# Patient Record
Sex: Female | Born: 1965 | Race: White | Hispanic: No | Marital: Married | Smoking: Former smoker
Health system: Southern US, Community
[De-identification: ages and names within clinical notes are randomized; demographics above are authoritative.]

## PROBLEM LIST (undated history)

## (undated) DIAGNOSIS — N951 Menopausal and female climacteric states: Secondary | ICD-10-CM

## (undated) DIAGNOSIS — N63 Unspecified lump in unspecified breast: Secondary | ICD-10-CM

## (undated) DIAGNOSIS — E538 Deficiency of other specified B group vitamins: Secondary | ICD-10-CM

## (undated) DIAGNOSIS — J45909 Unspecified asthma, uncomplicated: Secondary | ICD-10-CM

## (undated) DIAGNOSIS — C4491 Basal cell carcinoma of skin, unspecified: Secondary | ICD-10-CM

## (undated) DIAGNOSIS — M1990 Unspecified osteoarthritis, unspecified site: Secondary | ICD-10-CM

## (undated) DIAGNOSIS — F418 Other specified anxiety disorders: Secondary | ICD-10-CM

## (undated) HISTORY — DX: Unspecified osteoarthritis, unspecified site: M19.90

## (undated) HISTORY — DX: Other specified anxiety disorders: F41.8

## (undated) HISTORY — PX: APPENDECTOMY: SHX54

## (undated) HISTORY — DX: Deficiency of other specified B group vitamins: E53.8

## (undated) HISTORY — DX: Unspecified lump in unspecified breast: N63.0

## (undated) HISTORY — DX: Basal cell carcinoma of skin, unspecified: C44.91

## (undated) HISTORY — DX: Menopausal and female climacteric states: N95.1

## (undated) HISTORY — PX: OTHER SURGICAL HISTORY: SHX169

## (undated) HISTORY — PX: CRYOTHERAPY: SHX1416

---

## 2008-02-21 ENCOUNTER — Ambulatory Visit: Payer: Self-pay | Admitting: Unknown Physician Specialty

## 2010-03-07 ENCOUNTER — Ambulatory Visit: Payer: Self-pay | Admitting: Unknown Physician Specialty

## 2010-03-14 ENCOUNTER — Ambulatory Visit: Payer: Self-pay | Admitting: Internal Medicine

## 2012-02-26 ENCOUNTER — Ambulatory Visit: Payer: Self-pay

## 2012-06-22 DIAGNOSIS — N63 Unspecified lump in unspecified breast: Secondary | ICD-10-CM

## 2012-06-22 HISTORY — DX: Unspecified lump in unspecified breast: N63.0

## 2012-09-07 ENCOUNTER — Ambulatory Visit: Payer: Self-pay

## 2012-12-20 HISTORY — PX: BREAST BIOPSY: SHX20

## 2012-12-28 ENCOUNTER — Ambulatory Visit: Payer: Self-pay | Admitting: Surgery

## 2012-12-29 LAB — PATHOLOGY REPORT

## 2013-06-22 DIAGNOSIS — E538 Deficiency of other specified B group vitamins: Secondary | ICD-10-CM

## 2013-06-22 HISTORY — DX: Deficiency of other specified B group vitamins: E53.8

## 2014-08-21 ENCOUNTER — Ambulatory Visit: Payer: Self-pay

## 2015-07-09 ENCOUNTER — Encounter: Payer: Self-pay | Admitting: *Deleted

## 2015-07-09 ENCOUNTER — Inpatient Hospital Stay: Admission: RE | Admit: 2015-07-09 | Payer: Self-pay | Source: Ambulatory Visit

## 2015-07-09 NOTE — Patient Instructions (Addendum)
  Your procedure is scheduled on: 07/11/15 Report to Day Surgery.[facility] SECOND FLOOR To find out your arrival time please call [phone] between 1PM - 3PM on 07/10/15  Remember: Instructions that are not followed completely may result in serious medical risk, up to and including death, or upon the discretion of your surgeon and anesthesiologist your surgery may need to be rescheduled.    _X___ 1. Do not eat food or drink liquids after midnight. No gum chewing or hard candies.     __X__ 2. No Alcohol for 24 hours before or after surgery.   ____ 3. Bring all medications with you on the day of surgery if instructed.    _X___ 4. Notify your doctor if there is any change in your medical condition     (cold, fever, infections).     Do not wear jewelry, make-up, hairpins, clips or nail polish.  Do not wear lotions, powders, or perfumes. You may wear deodorant.  Do not shave 48 hours prior to surgery. Men may shave face and neck.  Do not bring valuables to the hospital.    [facility] is not responsible for any belongings or valuables.               Contacts, dentures or bridgework may not be worn into surgery.  Leave your suitcase in the car. After surgery it may be brought to your room.  For patients admitted to the hospital, discharge time is determined by your                treatment team.   Patients discharged the day of surgery will not be allowed to drive home.   Please read over the following fact sheets that you were given:   Surgical Site Infection Prevention   ____ Take these medicines the morning of surgery with A SIP OF WATER:    1. MICRONOR  2.   3.   4.  5.  6.  ____ Fleet Enema (as directed)   ____ Use CHG Soap as directed  ____ Use inhalers on the day of surgery  ____ Stop metformin 2 days prior to surgery    ____ Take 1/2 of usual insulin dose the night before surgery and none on the morning of surgery.   ____ Stop Coumadin/Plavix/aspirin on    ____ Stop Anti-inflammatories on  _X___ Stop supplements until after surgery.    ____ Bring C-Pap to the hospital.

## 2015-07-11 ENCOUNTER — Ambulatory Visit: Payer: Managed Care, Other (non HMO) | Admitting: Anesthesiology

## 2015-07-11 ENCOUNTER — Ambulatory Visit
Admission: RE | Admit: 2015-07-11 | Discharge: 2015-07-11 | Disposition: A | Discharge status: Home / Self Care | Payer: Managed Care, Other (non HMO) | Source: Ambulatory Visit | Attending: Orthopedic Surgery | Admitting: Orthopedic Surgery

## 2015-07-11 ENCOUNTER — Encounter
Admission: RE | Disposition: A | Discharge status: Home / Self Care | Payer: Self-pay | Source: Ambulatory Visit | Attending: Orthopedic Surgery

## 2015-07-11 DIAGNOSIS — M778 Other enthesopathies, not elsewhere classified: Secondary | ICD-10-CM | POA: Insufficient documentation

## 2015-07-11 DIAGNOSIS — Z85828 Personal history of other malignant neoplasm of skin: Secondary | ICD-10-CM | POA: Insufficient documentation

## 2015-07-11 DIAGNOSIS — M159 Polyosteoarthritis, unspecified: Secondary | ICD-10-CM | POA: Insufficient documentation

## 2015-07-11 DIAGNOSIS — E538 Deficiency of other specified B group vitamins: Secondary | ICD-10-CM | POA: Insufficient documentation

## 2015-07-11 DIAGNOSIS — Z801 Family history of malignant neoplasm of trachea, bronchus and lung: Secondary | ICD-10-CM | POA: Diagnosis not present

## 2015-07-11 DIAGNOSIS — F329 Major depressive disorder, single episode, unspecified: Secondary | ICD-10-CM | POA: Diagnosis not present

## 2015-07-11 DIAGNOSIS — Z8249 Family history of ischemic heart disease and other diseases of the circulatory system: Secondary | ICD-10-CM | POA: Diagnosis not present

## 2015-07-11 DIAGNOSIS — Z79899 Other long term (current) drug therapy: Secondary | ICD-10-CM | POA: Diagnosis not present

## 2015-07-11 DIAGNOSIS — Z820 Family history of epilepsy and other diseases of the nervous system: Secondary | ICD-10-CM | POA: Diagnosis not present

## 2015-07-11 DIAGNOSIS — M25842 Other specified joint disorders, left hand: Secondary | ICD-10-CM | POA: Diagnosis not present

## 2015-07-11 DIAGNOSIS — Z7982 Long term (current) use of aspirin: Secondary | ICD-10-CM | POA: Insufficient documentation

## 2015-07-11 HISTORY — PX: GANGLION CYST EXCISION: SHX1691

## 2015-07-11 LAB — POCT PREGNANCY, URINE: PREG TEST UR: NEGATIVE

## 2015-07-11 SURGERY — EXCISION, GANGLION CYST, WRIST
Anesthesia: General | Site: Finger | Laterality: Left | Wound class: Clean

## 2015-07-11 MED ORDER — ONDANSETRON HCL 4 MG PO TABS
4.0000 mg | ORAL_TABLET | Freq: Four times a day (QID) | ORAL | Status: DC | PRN
Start: 2015-07-11 — End: 2015-07-11

## 2015-07-11 MED ORDER — ONDANSETRON HCL 4 MG/2ML IJ SOLN: 4.0000 mg | Freq: Once | INTRAMUSCULAR | Status: DC | PRN

## 2015-07-11 MED ORDER — FAMOTIDINE 20 MG PO TABS
ORAL_TABLET | ORAL | Status: AC
  Filled 2015-07-11: qty 1

## 2015-07-11 MED ORDER — CHLORHEXIDINE GLUCONATE 4 % EX LIQD
60.0000 mL | Freq: Once | CUTANEOUS | Status: AC
  Administered 2015-07-11: 4 via TOPICAL

## 2015-07-11 MED ORDER — PROPOFOL 10 MG/ML IV BOLUS
INTRAVENOUS | Status: DC | PRN
  Administered 2015-07-11: 150 mg via INTRAVENOUS

## 2015-07-11 MED ORDER — SODIUM CHLORIDE 0.9 % IV SOLN: INTRAVENOUS | Status: DC

## 2015-07-11 MED ORDER — LIDOCAINE HCL (CARDIAC) 20 MG/ML IV SOLN
INTRAVENOUS | Status: DC | PRN
  Administered 2015-07-11: 100 mg via INTRAVENOUS

## 2015-07-11 MED ORDER — ONDANSETRON HCL 4 MG/2ML IJ SOLN
INTRAMUSCULAR | Status: DC | PRN
  Administered 2015-07-11: 4 mg via INTRAVENOUS

## 2015-07-11 MED ORDER — FENTANYL CITRATE (PF) 100 MCG/2ML IJ SOLN
INTRAMUSCULAR | Status: DC | PRN
  Administered 2015-07-11: 50 ug via INTRAVENOUS

## 2015-07-11 MED ORDER — LACTATED RINGERS IV SOLN
INTRAVENOUS | Status: DC
  Administered 2015-07-11 (×2): via INTRAVENOUS

## 2015-07-11 MED ORDER — FAMOTIDINE 20 MG PO TABS
20.0000 mg | ORAL_TABLET | Freq: Once | ORAL | Status: AC
  Administered 2015-07-11: 20 mg via ORAL

## 2015-07-11 MED ORDER — HYDROCODONE-ACETAMINOPHEN 5-325 MG PO TABS: 1.0000 | ORAL_TABLET | ORAL | Status: DC | PRN

## 2015-07-11 MED ORDER — ONDANSETRON HCL 4 MG/2ML IJ SOLN: 4.0000 mg | Freq: Four times a day (QID) | INTRAMUSCULAR | Status: DC | PRN

## 2015-07-11 MED ORDER — BUPIVACAINE HCL 0.5 % IJ SOLN
INTRAMUSCULAR | Status: DC | PRN
  Administered 2015-07-11: 10 mL

## 2015-07-11 MED ORDER — METOCLOPRAMIDE HCL 10 MG PO TABS: 5.0000 mg | ORAL_TABLET | Freq: Three times a day (TID) | ORAL | Status: DC | PRN

## 2015-07-11 MED ORDER — MIDAZOLAM HCL 2 MG/2ML IJ SOLN
INTRAMUSCULAR | Status: DC | PRN
  Administered 2015-07-11: 2 mg via INTRAVENOUS

## 2015-07-11 MED ORDER — METOCLOPRAMIDE HCL 5 MG/ML IJ SOLN: 5.0000 mg | Freq: Three times a day (TID) | INTRAMUSCULAR | Status: DC | PRN

## 2015-07-11 MED ORDER — BUPIVACAINE HCL (PF) 0.5 % IJ SOLN
INTRAMUSCULAR | Status: AC
  Filled 2015-07-11: qty 30

## 2015-07-11 MED ORDER — FENTANYL CITRATE (PF) 100 MCG/2ML IJ SOLN: 25.0000 ug | INTRAMUSCULAR | Status: DC | PRN

## 2015-07-11 SURGICAL SUPPLY — 31 items
BANDAGE ELASTIC 3 LF NS (GAUZE/BANDAGES/DRESSINGS) IMPLANT
BNDG ESMARK 4X12 TAN STRL LF (GAUZE/BANDAGES/DRESSINGS) IMPLANT
BNDG GAUZE 1X2.1 STRL (MISCELLANEOUS) ×2 IMPLANT
CANISTER SUCT 1200ML W/VALVE (MISCELLANEOUS) ×2 IMPLANT
CAST PADDING 3X4FT ST 30246 (SOFTGOODS)
CHLORAPREP W/TINT 26ML (MISCELLANEOUS) ×2 IMPLANT
ELECT CAUTERY NEEDLE 2.0 MIC (NEEDLE) ×2 IMPLANT
GAUZE PETRO XEROFOAM 1X8 (MISCELLANEOUS) ×2 IMPLANT
GAUZE SPONGE 4X4 12PLY STRL (GAUZE/BANDAGES/DRESSINGS) IMPLANT
GAUZE SPONGE NON-WVN 2X2 STRL (MISCELLANEOUS) ×2 IMPLANT
GLOVE BIOGEL PI IND STRL 9 (GLOVE) ×1 IMPLANT
GLOVE BIOGEL PI INDICATOR 9 (GLOVE) ×1
GLOVE SURG ORTHO 9.0 STRL STRW (GLOVE) ×2 IMPLANT
GOWN SPECIALTY ULTRA XL (MISCELLANEOUS) ×2 IMPLANT
GOWN STRL REUS W/ TWL LRG LVL3 (GOWN DISPOSABLE) ×1 IMPLANT
GOWN STRL REUS W/TWL LRG LVL3 (GOWN DISPOSABLE) ×1
KIT RM TURNOVER STRD PROC AR (KITS) ×2 IMPLANT
NEEDLE HYPO 25X1 1.5 SAFETY (NEEDLE) ×2 IMPLANT
NS IRRIG 500ML POUR BTL (IV SOLUTION) ×2 IMPLANT
PACK EXTREMITY ARMC (MISCELLANEOUS) ×2 IMPLANT
PAD CAST CTTN 3X4 STRL (SOFTGOODS) IMPLANT
SPLINT CAST 1 STEP 3X12 (MISCELLANEOUS) IMPLANT
SPONGE VERSALON 2X2 STRL (MISCELLANEOUS) ×2
STOCKINETTE STRL 4IN 9604848 (GAUZE/BANDAGES/DRESSINGS) ×2 IMPLANT
SUT ETHILON 4-0 (SUTURE)
SUT ETHILON 4-0 FS2 18XMFL BLK (SUTURE)
SUT ETHILON 5-0 FS-2 18 BLK (SUTURE) ×2 IMPLANT
SUT MNCRL 4-0 (SUTURE)
SUT MNCRL 4-0 27XMFL (SUTURE)
SUTURE ETHLN 4-0 FS2 18XMF BLK (SUTURE) IMPLANT
SUTURE MNCRL 4-0 27XMF (SUTURE) IMPLANT

## 2015-07-11 NOTE — Anesthesia Preprocedure Evaluation (Addendum)
Anesthesia Evaluation  Patient identified by MRN, date of birth, ID band Patient awake    Reviewed: Allergy & Precautions, NPO status , Patient's Chart, lab work & pertinent test results  Airway Mallampati: II  TM Distance: >3 FB Neck ROM: Full    Dental no notable dental hx.    Pulmonary former smoker,    Pulmonary exam normal        Cardiovascular negative cardio ROS Normal cardiovascular exam     Neuro/Psych negative neurological ROS  negative psych ROS   GI/Hepatic negative GI ROS, Neg liver ROS,   Endo/Other  negative endocrine ROS  Renal/GU negative Renal ROS  negative genitourinary   Musculoskeletal negative musculoskeletal ROS (+)   Abdominal Normal abdominal exam  (+)   Peds negative pediatric ROS (+)  Hematology negative hematology ROS (+)   Anesthesia Other Findings Skin CA  Reproductive/Obstetrics                            Anesthesia Physical Anesthesia Plan  ASA: II  Anesthesia Plan: General   Post-op Pain Management:    Induction: Intravenous  Airway Management Planned: LMA  Additional Equipment:   Intra-op Plan:   Post-operative Plan: Extubation in OR  Informed Consent: I have reviewed the patients History and Physical, chart, labs and discussed the procedure including the risks, benefits and alternatives for the proposed anesthesia with the patient or authorized representative who has indicated his/her understanding and acceptance.   Dental advisory given  Plan Discussed with: CRNA and Surgeon  Anesthesia Plan Comments:         Anesthesia Quick Evaluation

## 2015-07-11 NOTE — Transfer of Care (Signed)
Immediate Anesthesia Transfer of Care Note  Patient: Carla Ellis  Procedure(s) Performed: Procedure(s): EXCISION OF MUCOUS CYST LEFT INDEX FINGER (Left)  Patient Location: PACU  Anesthesia Type:General  Level of Consciousness: sedated  Airway & Oxygen Therapy: Patient Spontanous Breathing and Patient connected to face mask oxygen  Post-op Assessment: Report given to RN and Post -op Vital signs reviewed and stable  Post vital signs: Reviewed and stable  Last Vitals:  Filed Vitals:   07/11/15 0601  BP: 105/65  Temp: 36 C  Resp: 16    Complications: No apparent anesthesia complications

## 2015-07-11 NOTE — Anesthesia Procedure Notes (Signed)
Procedure Name: LMA Insertion Date/Time: 07/11/2015 7:44 AM Performed by: Nelda Marseille Pre-anesthesia Checklist: Patient identified, Patient being monitored, Timeout performed, Emergency Drugs available and Suction available Patient Re-evaluated:Patient Re-evaluated prior to inductionOxygen Delivery Method: Circle system utilized Preoxygenation: Pre-oxygenation with 100% oxygen Intubation Type: IV induction Ventilation: Mask ventilation without difficulty LMA: LMA inserted Tube type: Oral Number of attempts: 1 Placement Confirmation: positive ETCO2 and breath sounds checked- equal and bilateral Tube secured with: Tape Dental Injury: Teeth and Oropharynx as per pre-operative assessment

## 2015-07-11 NOTE — Discharge Instructions (Addendum)
°  AMBULATORY SURGERY  DISCHARGE INSTRUCTIONS   1) The drugs that you were given will stay in your system until tomorrow so for the next 24 hours you should not:  A) Drive an automobile B) Make any legal decisions C) Drink any alcoholic beverage   2) You may resume regular meals tomorrow.  Today it is better to start with liquids and gradually work up to solid foods.  You may eat anything you prefer, but it is better to start with liquids, then soup and crackers, and gradually work up to solid foods.   3) Please notify your doctor immediately if you have any unusual bleeding, trouble breathing, redness and pain at the surgery site, drainage, fever, or pain not relieved by medication.    4) Additional Instructions:                                                                                                        Keep dressing clean and dry, reinforce as needed with additional gauze Apply ice as needed for pain and/or swelling Keep elevated at all times Take pain medication as directed        Please contact your physician with any problems or Same Day Surgery at [phone], Monday through Friday 6 am to 4 pm, or [place] [facility] number at [phone].

## 2015-07-11 NOTE — Op Note (Signed)
07/11/2015  8:09 AM  PATIENT:  Carla Ellis  50 y.o. female  PRE-OPERATIVE DIAGNOSIS:  DIGITAL MUCOUS CYST left index finger DIP joint  POST-OPERATIVE DIAGNOSIS:  DIGITAL MUCOUS CYST same  PROCEDURE:  Procedure(s): EXCISION OF MUCOUS CYST LEFT INDEX FINGER (Left)  SURGEON: Laurene Footman, MD  ASSISTANTS: None  ANESTHESIA:   general  EBL:     BLOOD ADMINISTERED:none  DRAINS: none   LOCAL MEDICATIONS USED:  MARCAINE     SPECIMEN:  Source of Specimen:  Skin and cyst wall from DIP joint  DISPOSITION OF SPECIMEN:  PATHOLOGY  COUNTS:  YES  TOURNIQUET:   14 minutes at 250 mmHg  IMPLANTS: None  DICTATION: .Dragon Dictation patient brought the operating room and after adequate anesthesia was obtained, the left arm was prepped and draped in sterile fashion. After patient identification and timeout procedure were completed, a digital block was given for postop analgesia with 10 cc half percent Sensorcaine without epinephrine. The tourniquet was then raised and elliptical excision of the cyst cyst with skin or overlying it was performed with extension to the radial side. The cyst was on the ulnar side of the DIP joint skin was undermined and the extensor tendon identified and protected a small osteotome was used and then rongeur to remove a dorsal spur from the DIP joint to joint was then irrigated and then the skin closed with simple interrupted 5-0 nylon. Dressing then applied with Xeroform 2 x 2's and a finger roll  PLAN OF CARE: Discharge to home after PACU  PATIENT DISPOSITION:  PACU - hemodynamically stable.

## 2015-07-11 NOTE — H&P (Signed)
Reviewed paper H+P, will be scanned into chart. No changes noted.  

## 2015-07-12 LAB — SURGICAL PATHOLOGY

## 2015-07-12 NOTE — Anesthesia Postprocedure Evaluation (Signed)
Anesthesia Post Note  Patient: Carla Ellis  Procedure(s) Performed: Procedure(s) (LRB): EXCISION OF MUCOUS CYST LEFT INDEX FINGER (Left)  Patient location during evaluation: PACU Anesthesia Type: General Level of consciousness: awake and alert and oriented Pain management: pain level controlled Vital Signs Assessment: post-procedure vital signs reviewed and stable Cardiovascular status: blood pressure returned to baseline Anesthetic complications: no    Last Vitals:  Filed Vitals:   07/11/15 0900 07/11/15 0914  BP: 112/68 107/69  Pulse: 50 48  Temp:    Resp: 16 16    Last Pain:  Filed Vitals:   07/12/15 0811  PainSc: 5                  Guy Toney

## 2016-05-20 ENCOUNTER — Other Ambulatory Visit: Payer: Self-pay | Admitting: Internal Medicine

## 2016-05-20 DIAGNOSIS — Z1231 Encounter for screening mammogram for malignant neoplasm of breast: Secondary | ICD-10-CM

## 2016-05-26 ENCOUNTER — Ambulatory Visit: Payer: Managed Care, Other (non HMO)

## 2016-06-26 ENCOUNTER — Ambulatory Visit
Admission: RE | Admit: 2016-06-26 | Discharge: 2016-06-26 | Disposition: A | Discharge status: Home / Self Care | Payer: Managed Care, Other (non HMO) | Source: Ambulatory Visit | Attending: Internal Medicine | Admitting: Internal Medicine

## 2016-06-26 DIAGNOSIS — Z1231 Encounter for screening mammogram for malignant neoplasm of breast: Secondary | ICD-10-CM | POA: Diagnosis present

## 2017-01-26 ENCOUNTER — Emergency Department
Admission: EM | Admit: 2017-01-26 | Discharge: 2017-01-26 | Disposition: A | Discharge status: Home / Self Care | Payer: Managed Care, Other (non HMO) | Attending: Emergency Medicine | Admitting: Emergency Medicine

## 2017-01-26 ENCOUNTER — Encounter: Payer: Self-pay | Admitting: *Deleted

## 2017-01-26 ENCOUNTER — Telehealth: Payer: Self-pay | Admitting: Emergency Medicine

## 2017-01-26 DIAGNOSIS — R110 Nausea: Secondary | ICD-10-CM | POA: Insufficient documentation

## 2017-01-26 DIAGNOSIS — R51 Headache: Secondary | ICD-10-CM | POA: Insufficient documentation

## 2017-01-26 DIAGNOSIS — Z5321 Procedure and treatment not carried out due to patient leaving prior to being seen by health care provider: Secondary | ICD-10-CM | POA: Diagnosis not present

## 2017-01-26 NOTE — ED Triage Notes (Signed)
Pt reports frontal HA last several days accomp by nausea; st hx of same

## 2017-01-26 NOTE — Telephone Encounter (Signed)
Called patient due to lwot to inquire about condition and follow up plans. Says headache is better than it was.  I told her she should still inform her doctor that she had the really bad headache.  She agrees.

## 2017-03-29 ENCOUNTER — Telehealth: Payer: Self-pay

## 2017-03-29 NOTE — Telephone Encounter (Signed)
Pt states she is still taking the progesterone only pill and has not had a period in over a year. Pt aware CLG to call to discuss in more detail.

## 2017-03-29 NOTE — Telephone Encounter (Signed)
Pt and CLG have discussed HRT in the past and pt has declined.  She now feels she needs HRT.  Would like premarin pill and patch called in to [facility] [address].  [phone]

## 2017-03-29 NOTE — Telephone Encounter (Signed)
Pt calling to see if rxs for premarin and patch going to be called in today.  Please call.  [phone]

## 2017-03-30 MED ORDER — ESTRADIOL 0.05 MG/24HR TD PTTW: 1.0000 | MEDICATED_PATCH | TRANSDERMAL | 5 refills | Status: DC

## 2017-03-30 NOTE — Telephone Encounter (Signed)
Caaled patient and discussed options. RX sent to pharmacy.

## 2017-03-30 NOTE — Telephone Encounter (Signed)
Patient desires to start estrogen for hot flashes, night sweats and mood swings. Currently taking progesterone only pill and has been amenorrheic on that. Explained different forms of estrogen/ benefits (decreased vasomotor symptoms, decreased bone loss, decreased colon cancer)/ risks (thromboembolism, breast cancer). Also explained need to stay on a progestin/ progesterone. She would like to start the Vivelle Dot patches and continue on the progesterone only pill. FU in February at time of annual.

## 2017-04-22 ENCOUNTER — Ambulatory Visit: Payer: Self-pay | Admitting: Certified Nurse Midwife

## 2017-05-21 ENCOUNTER — Other Ambulatory Visit: Payer: Self-pay | Admitting: Internal Medicine

## 2017-05-21 DIAGNOSIS — Z1231 Encounter for screening mammogram for malignant neoplasm of breast: Secondary | ICD-10-CM

## 2017-07-02 ENCOUNTER — Ambulatory Visit
Admission: RE | Admit: 2017-07-02 | Discharge: 2017-07-02 | Disposition: A | Discharge status: Home / Self Care | Payer: Managed Care, Other (non HMO) | Source: Ambulatory Visit | Attending: Internal Medicine | Admitting: Internal Medicine

## 2017-07-02 DIAGNOSIS — Z1231 Encounter for screening mammogram for malignant neoplasm of breast: Secondary | ICD-10-CM

## 2017-07-02 DIAGNOSIS — R928 Other abnormal and inconclusive findings on diagnostic imaging of breast: Secondary | ICD-10-CM | POA: Diagnosis not present

## 2017-07-08 ENCOUNTER — Other Ambulatory Visit: Payer: Self-pay | Admitting: Internal Medicine

## 2017-07-08 DIAGNOSIS — N6489 Other specified disorders of breast: Secondary | ICD-10-CM

## 2017-07-08 DIAGNOSIS — R928 Other abnormal and inconclusive findings on diagnostic imaging of breast: Secondary | ICD-10-CM

## 2017-07-19 ENCOUNTER — Ambulatory Visit
Admission: RE | Admit: 2017-07-19 | Discharge: 2017-07-19 | Disposition: A | Discharge status: Home / Self Care | Payer: Managed Care, Other (non HMO) | Source: Ambulatory Visit | Attending: Internal Medicine | Admitting: Internal Medicine

## 2017-07-19 DIAGNOSIS — N6489 Other specified disorders of breast: Secondary | ICD-10-CM | POA: Diagnosis present

## 2017-07-19 DIAGNOSIS — R928 Other abnormal and inconclusive findings on diagnostic imaging of breast: Secondary | ICD-10-CM

## 2017-07-20 ENCOUNTER — Other Ambulatory Visit: Payer: Self-pay | Admitting: Certified Nurse Midwife

## 2017-08-11 ENCOUNTER — Ambulatory Visit (INDEPENDENT_AMBULATORY_CARE_PROVIDER_SITE_OTHER): Payer: Managed Care, Other (non HMO) | Admitting: Certified Nurse Midwife

## 2017-08-11 ENCOUNTER — Encounter: Payer: Self-pay | Admitting: Certified Nurse Midwife

## 2017-08-11 VITALS — BP 104/64 | HR 58 | Ht 66.0 in | Wt 164.0 lb

## 2017-08-11 DIAGNOSIS — Z79890 Hormone replacement therapy (postmenopausal): Secondary | ICD-10-CM | POA: Diagnosis not present

## 2017-08-11 DIAGNOSIS — N951 Menopausal and female climacteric states: Secondary | ICD-10-CM | POA: Insufficient documentation

## 2017-08-11 DIAGNOSIS — Z01419 Encounter for gynecological examination (general) (routine) without abnormal findings: Secondary | ICD-10-CM | POA: Diagnosis not present

## 2017-08-11 DIAGNOSIS — C4491 Basal cell carcinoma of skin, unspecified: Secondary | ICD-10-CM | POA: Insufficient documentation

## 2017-08-11 DIAGNOSIS — Z124 Encounter for screening for malignant neoplasm of cervix: Secondary | ICD-10-CM

## 2017-08-11 MED ORDER — NORETHINDRONE 0.35 MG PO TABS: 1.0000 | ORAL_TABLET | Freq: Every day | ORAL | 3 refills | Status: DC

## 2017-08-11 MED ORDER — ESTRADIOL 0.05 MG/24HR TD PTTW
1.0000 | MEDICATED_PATCH | TRANSDERMAL | 11 refills | Status: AC
Start: 2017-08-12 — End: ?

## 2017-08-11 NOTE — Progress Notes (Signed)
Gynecology Annual Exam  PCP: Patient, No Pcp Per  Chief Complaint:  Chief Complaint  Patient presents with  . Gynecologic Exam    History of Present Illness:Carla Ellis is a 52 year old Caucasian/White female, [place], who presents for her annual exam and for follow up since starting HT for her menopausal symptoms in October 2018. She reports that her hot flashes are decreased and her moods are much improved. She is not as irritable and is sleeping better.   She has been amenorrheic on her POP since 2016 and last year she had an elevated FSH and LH. She did have some spotting on her POP in Jan 2018 and after starting the Vivelle Dot patch she saw some brownish discharge in December x 1-2 days when she wiped.. She continues on the POP with the patch, as well as Estroven and vitamin E.   The patient's past medical history is notable for a history of a left breast biopsy in 2014 that was benign and a remote history of an abnormal Pap in her 75s and cryotherapy.. She has had multiple skin biopsies for basal cell carcinoma. She also has a history of vitamin B12 deficiency and depression.  She is sexually active. She is currently using progesterone only pills for contraception.  Her most recent pap smear was obtained 08/07/2016 and was NIL. Her most recent mammogram obtained on 07/19/2017 was normal and revealed no significant changes.  There is no family history of breast cancer.  There is no family history of ovarian cancer.  The patient does not do monthly self breast exams.  She had a colonoscopy 11/06/2016 which was normal. Next due in 10 years. The patient does not smoke. Former smoker in her early 37s The patient does drink occasionally.  The patient does not use illegal drugs.  The patient started exercising 5x/week in January. The patient does get adequate calcium in her diet Also takes vitamin D3 1000IU daily.  She had a recent cholesterol screen in 04/2017 by PCP Dr  Caryl Comes that was normal.    The patient denies current symptoms of depression.    Review of Systems: Review of Systems  Constitutional: Negative for chills, fever and weight loss.  HENT: Negative for congestion, sinus pain and sore throat.   Eyes: Negative for blurred vision and pain.  Respiratory: Negative for hemoptysis, shortness of breath and wheezing.   Cardiovascular: Negative for chest pain, palpitations and leg swelling.  Gastrointestinal: Negative for abdominal pain, blood in stool, diarrhea, heartburn, nausea and vomiting.  Genitourinary: Negative for dysuria, frequency, hematuria and urgency.  Musculoskeletal: Positive for joint pain (left thumb/hand). Negative for back pain and myalgias.  Skin: Negative for itching and rash.  Neurological: Negative for dizziness, tingling and headaches.  Endo/Heme/Allergies: Negative for environmental allergies and polydipsia. Does not bruise/bleed easily.       Negative for hirsutism; positive for hot flashes   Psychiatric/Behavioral: Negative for depression. The patient is not nervous/anxious and does not have insomnia.        Mood swings/irritability improved on HT    Past Medical History:  Past Medical History:  Diagnosis Date  . Anxiety with depression   . Basal cell carcinoma    chest, back, arm, leg, shoulder  . Breast mass 2014   left breast negative biopsy  . Menopausal symptoms   . Osteoarthritis   . Vitamin B12 deficiency 2015    Past Surgical History:  Past Surgical History:  Procedure Laterality  Date  . APPENDECTOMY    . 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 CRYOTHERAPY     in her 20s normal paps since  . GANGLION CYST EXCISION Left 07/11/2015   Procedure: EXCISION OF MUCOUS CYST LEFT INDEX FINGER;  Surgeon: Hessie Knows, MD;  Location: [facility] ORS;  Service: Orthopedics;  Laterality: Left;    Family History:  Family History  Problem Relation Age of Onset  . Alzheimer's disease Mother   . Arthritis  Mother   . Lung cancer Father 50  . Diabetes Sister   . Diabetes Brother   . Hypertension Brother   . Heart attack Brother   . Lung cancer Sister 58  . Breast cancer Neg Hx     Social History:  Social History   Socioeconomic History  . Marital status: Married    Spouse name: Not on file  . Number of children: 2  . Years of education: 77  . Highest education level: Not on file  Social Needs  . Financial resource strain: Not on file  . Food insecurity - worry: Not on file  . Food insecurity - inability: Not on file  . Transportation needs - medical: Not on file  . Transportation needs - non-medical: Not on file  Occupational History  . Occupation: Quality Control  Tobacco Use  . Smoking status: Former Smoker    Last attempt to quit: 07/08/1992    Years since quitting: 25.1  . Smokeless tobacco: Never Used  Substance and Sexual Activity  . Alcohol use: Yes    Comment: occasional  . Drug use: No  . Sexual activity: Yes    Birth control/protection: Pill  Other Topics Concern  . Not on file  Social History Narrative  . Not on file    Allergies:  No Known Allergies  Medications:  Current Outpatient Medications:  .  Biotin 1000 MCG tablet, Take by mouth., Disp: , Rfl:  .  Black Cohosh-SoyIsoflav-Magnol (ESTROVEN MENOPAUSE RELIEF PO), Take by mouth., Disp: , Rfl:  .  Cholecalciferol (VITAMIN D HIGH POTENCY PO), Take 5,000 Units by mouth., Disp: , Rfl:  .  Cyanocobalamin (VITAMIN B 12 PO), Take 500 mg by mouth., Disp: , Rfl:  .  [START ON 08/12/2017] estradiol (VIVELLE-DOT) 0.05 MG/24HR patch, Place 1 patch (0.05 mg total) onto the skin 2 (two) times a week., Disp: 8 patch, Rfl: 11 .  norethindrone (CAMILA) 0.35 MG tablet, Take 1 tablet (0.35 mg total) by mouth daily., Disp: 84 tablet, Rfl: 3 .  vitamin E 1000 UNIT capsule, Take 1,000 Units by mouth daily., Disp: , Rfl:     Physical Exam Vitals: BP 104/64   Pulse (!) 58   Ht 5\' 6"  (1.676 m)   Wt 164 lb (74.4 kg)    LMP 05/17/2015 (Exact Date)   BMI 26.47 kg/m   General: WF in NAD HEENT: normocephalic, anicteric Neck: no thyroid enlargement, no palpable nodules, no cervical lymphadenopathy  Pulmonary: No increased work of breathing, CTAB Cardiovascular: RRR, without murmur  Breast: Breast symmetrical, no tenderness, no palpable nodules or masses, no skin or nipple retraction present, no nipple discharge.  No axillary, infraclavicular or supraclavicular lymphadenopathy. Abdomen: Soft, non-tender, non-distended.  Vertical scar lower abdomen from appy. Umbilicus without lesions.  No hepatomegaly or masses palpable. No evidence of hernia. Genitourinary:  External: Normal external female genitalia.  Normal urethral meatus, normal Bartholin's and Skene's glands.   On right buttock near perineum there is a 4x59mm hyperpigmented mole (derm  is watching)  Vagina: Normal vaginal mucosa, no evidence of prolapse.    Cervix: Stenotic, no bleeding, non-tender  Uterus: Retroflexed, normal size, shape, and consistency, mobile, and non-tender  Adnexa: No adnexal masses, non-tender  Rectal: deferred  Lymphatic: no evidence of inguinal lymphadenopathy Extremities: no edema, erythema, or tenderness Neurologic: Grossly intact Psychiatric: mood appropriate, affect full     Assessment: 52 y.o. perimenopausal woman with normal annual gyn exam Decreased menopausal sx on Vivelle patch-would like to continue patch and the POP. Offered to switch to a different progesterone/progestin. Would like to stay on pill another year for contraception  Refills of Vivelle patch 0.05 and Camilla sent to pharmacy  Plan:   1) Breast cancer screening - recommend monthly self breast exam and annual mammograms. Mammogram is up to date.  2) Cervical cancer screening - Pap was done.  3) Contraception - Camilla  4) Routine healthcare maintenance including cholesterol and diabetes screening managed by PCP   5) Osteoporosis prevention: has  adequate calcium and vitamin D intake and is exercising.  6) RTO in 1 year and prn.  Dalia Heading, CNM

## 2017-08-14 LAB — IGP, APTIMA HPV
HPV Aptima: NEGATIVE
PAP SMEAR COMMENT: 0

## 2018-06-24 ENCOUNTER — Other Ambulatory Visit: Payer: Self-pay | Admitting: Internal Medicine

## 2018-06-24 DIAGNOSIS — Z1231 Encounter for screening mammogram for malignant neoplasm of breast: Secondary | ICD-10-CM

## 2018-07-11 ENCOUNTER — Ambulatory Visit
Admission: RE | Admit: 2018-07-11 | Discharge: 2018-07-11 | Disposition: A | Discharge status: Home / Self Care | Payer: Managed Care, Other (non HMO) | Source: Ambulatory Visit | Attending: Internal Medicine | Admitting: Internal Medicine

## 2018-07-11 ENCOUNTER — Encounter (INDEPENDENT_AMBULATORY_CARE_PROVIDER_SITE_OTHER): Payer: Self-pay

## 2018-07-11 DIAGNOSIS — Z1231 Encounter for screening mammogram for malignant neoplasm of breast: Secondary | ICD-10-CM | POA: Insufficient documentation

## 2018-09-12 ENCOUNTER — Other Ambulatory Visit: Payer: Self-pay | Admitting: Certified Nurse Midwife

## 2019-05-11 ENCOUNTER — Other Ambulatory Visit: Payer: Self-pay

## 2019-05-11 DIAGNOSIS — Z20822 Contact with and (suspected) exposure to covid-19: Secondary | ICD-10-CM

## 2019-05-14 LAB — NOVEL CORONAVIRUS, NAA: SARS-CoV-2, NAA: NOT DETECTED

## 2019-06-26 ENCOUNTER — Other Ambulatory Visit: Payer: Self-pay | Admitting: Internal Medicine

## 2019-06-26 DIAGNOSIS — Z1231 Encounter for screening mammogram for malignant neoplasm of breast: Secondary | ICD-10-CM

## 2019-07-17 ENCOUNTER — Other Ambulatory Visit: Payer: Self-pay

## 2019-07-17 ENCOUNTER — Ambulatory Visit
Admission: RE | Admit: 2019-07-17 | Discharge: 2019-07-17 | Disposition: A | Discharge status: Home / Self Care | Payer: Managed Care, Other (non HMO) | Source: Ambulatory Visit | Attending: Internal Medicine | Admitting: Internal Medicine

## 2019-07-17 DIAGNOSIS — Z1231 Encounter for screening mammogram for malignant neoplasm of breast: Secondary | ICD-10-CM | POA: Diagnosis not present

## 2019-09-07 ENCOUNTER — Other Ambulatory Visit: Payer: Self-pay | Admitting: Certified Nurse Midwife

## 2020-06-24 ENCOUNTER — Other Ambulatory Visit: Payer: Self-pay | Admitting: Internal Medicine

## 2020-06-24 DIAGNOSIS — Z1231 Encounter for screening mammogram for malignant neoplasm of breast: Secondary | ICD-10-CM

## 2020-07-17 ENCOUNTER — Other Ambulatory Visit: Payer: Self-pay

## 2020-07-17 ENCOUNTER — Ambulatory Visit
Admission: RE | Admit: 2020-07-17 | Discharge: 2020-07-17 | Disposition: A | Discharge status: Home / Self Care | Payer: Managed Care, Other (non HMO) | Source: Ambulatory Visit | Attending: Internal Medicine | Admitting: Internal Medicine

## 2020-07-17 DIAGNOSIS — Z1231 Encounter for screening mammogram for malignant neoplasm of breast: Secondary | ICD-10-CM | POA: Insufficient documentation

## 2021-05-26 ENCOUNTER — Other Ambulatory Visit: Payer: Self-pay | Admitting: Internal Medicine

## 2021-05-26 ENCOUNTER — Other Ambulatory Visit: Payer: Self-pay | Admitting: Obstetrics and Gynecology

## 2021-05-26 DIAGNOSIS — Z1231 Encounter for screening mammogram for malignant neoplasm of breast: Secondary | ICD-10-CM

## 2021-07-21 ENCOUNTER — Ambulatory Visit
Admission: RE | Admit: 2021-07-21 | Discharge: 2021-07-21 | Disposition: A | Discharge status: Home / Self Care | Payer: Managed Care, Other (non HMO) | Source: Ambulatory Visit | Attending: Obstetrics and Gynecology | Admitting: Obstetrics and Gynecology

## 2021-07-21 ENCOUNTER — Other Ambulatory Visit: Payer: Self-pay

## 2021-07-21 DIAGNOSIS — Z1231 Encounter for screening mammogram for malignant neoplasm of breast: Secondary | ICD-10-CM | POA: Insufficient documentation

## 2021-09-17 LAB — COLOGUARD: COLOGUARD: NEGATIVE

## 2021-09-17 LAB — EXTERNAL GENERIC LAB PROCEDURE: COLOGUARD: NEGATIVE

## 2022-06-18 ENCOUNTER — Other Ambulatory Visit: Payer: Self-pay

## 2022-06-18 DIAGNOSIS — Z1231 Encounter for screening mammogram for malignant neoplasm of breast: Secondary | ICD-10-CM

## 2022-07-23 ENCOUNTER — Ambulatory Visit
Admission: RE | Admit: 2022-07-23 | Discharge: 2022-07-23 | Disposition: A | Discharge status: Home / Self Care | Payer: Managed Care, Other (non HMO) | Source: Ambulatory Visit | Attending: Internal Medicine | Admitting: Internal Medicine

## 2022-07-23 DIAGNOSIS — Z1231 Encounter for screening mammogram for malignant neoplasm of breast: Secondary | ICD-10-CM | POA: Insufficient documentation

## 2023-03-08 ENCOUNTER — Other Ambulatory Visit: Payer: Self-pay | Admitting: Unknown Physician Specialty

## 2023-03-08 DIAGNOSIS — R221 Localized swelling, mass and lump, neck: Secondary | ICD-10-CM

## 2023-03-09 ENCOUNTER — Ambulatory Visit
Admission: RE | Admit: 2023-03-09 | Discharge: 2023-03-09 | Disposition: A | Discharge status: Home / Self Care | Payer: Managed Care, Other (non HMO) | Source: Ambulatory Visit | Attending: Unknown Physician Specialty | Admitting: Unknown Physician Specialty

## 2023-03-09 DIAGNOSIS — R221 Localized swelling, mass and lump, neck: Secondary | ICD-10-CM

## 2023-06-07 IMAGING — MG MM DIGITAL SCREENING BILAT W/ TOMO AND CAD
6 of 10 series · 6 of 30 positions shown · non-contrast
Comparison: Previous exam(s).

CLINICAL DATA: Screening.

EXAM:
DIGITAL SCREENING BILATERAL MAMMOGRAM WITH TOMOSYNTHESIS AND CAD
TECHNIQUE: Bilateral screening digital craniocaudal and mediolateral oblique
mammograms were obtained. Bilateral screening digital breast
tomosynthesis was performed. The images were evaluated with
computer-aided detection.

[L MLO synth-2D (1 of 2)]
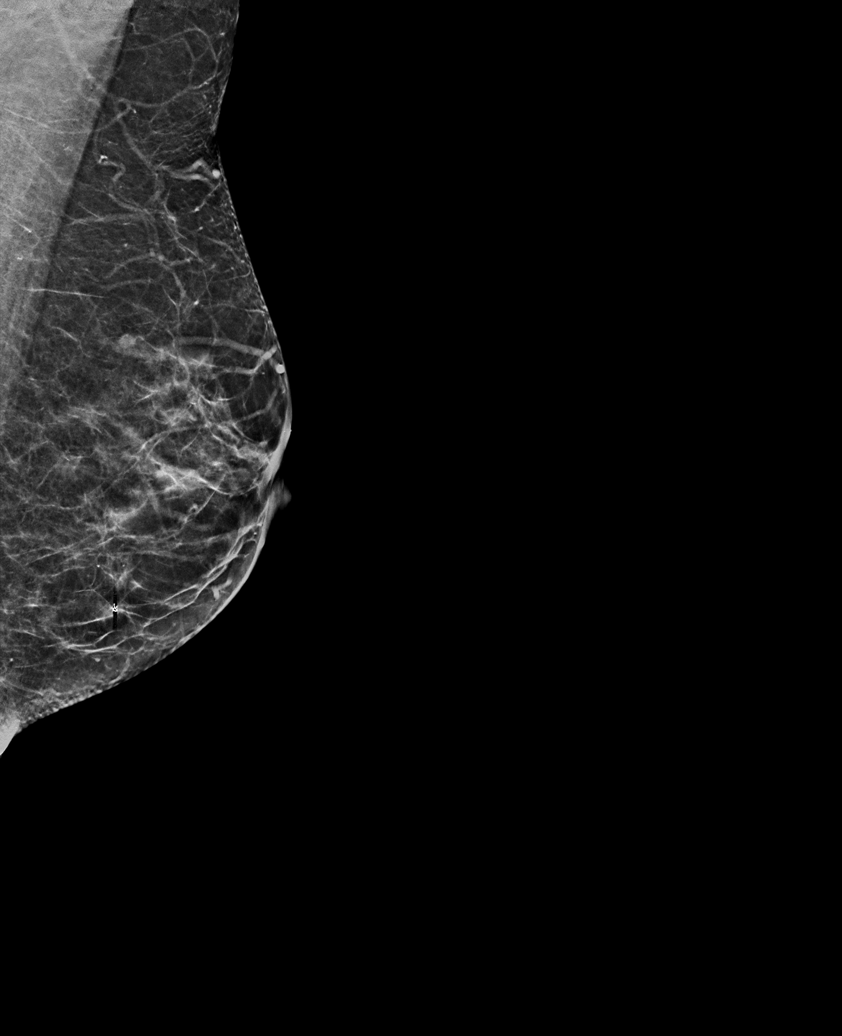

[L CC synth-2D]
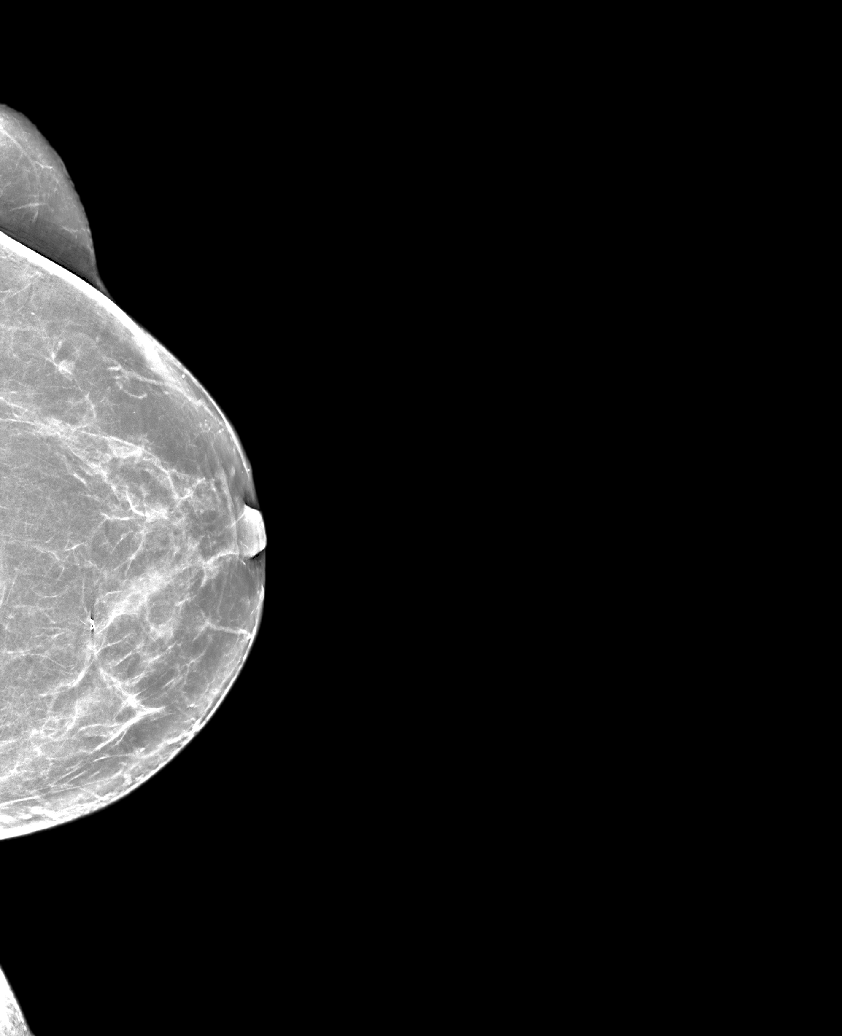

[L MLO synth-2D (2 of 2)]
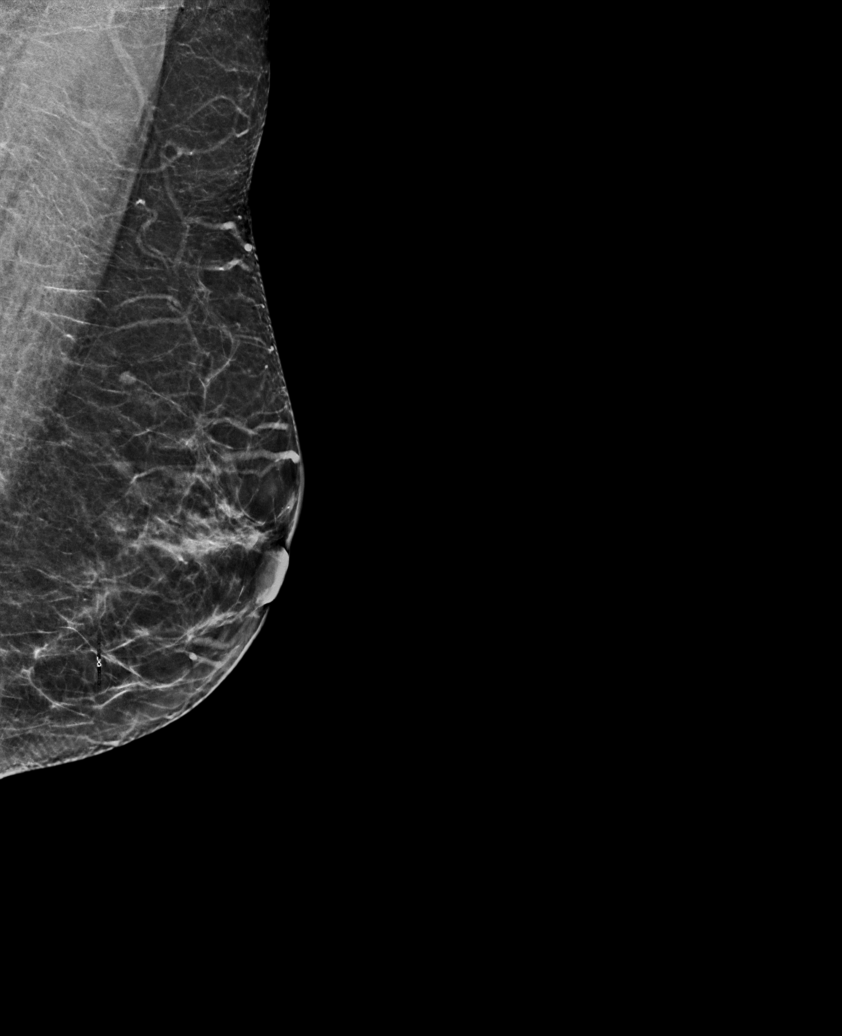

[R CC synth-2D]
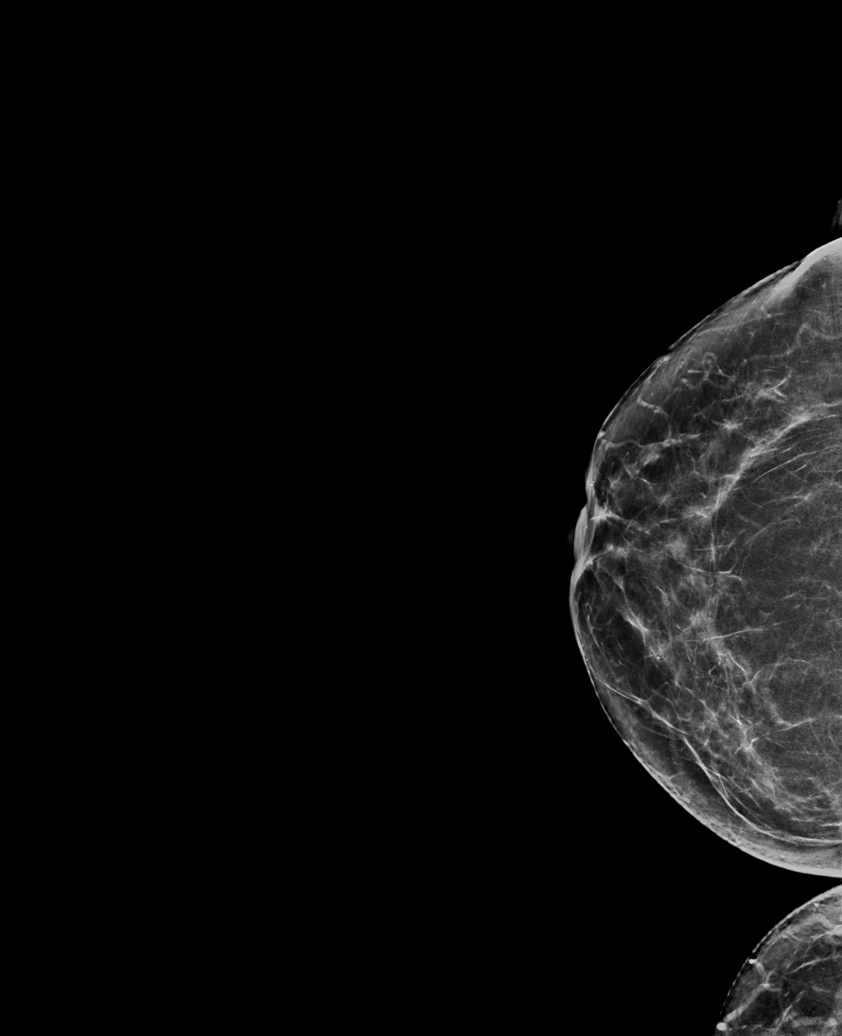

[R MLO synth-2D]
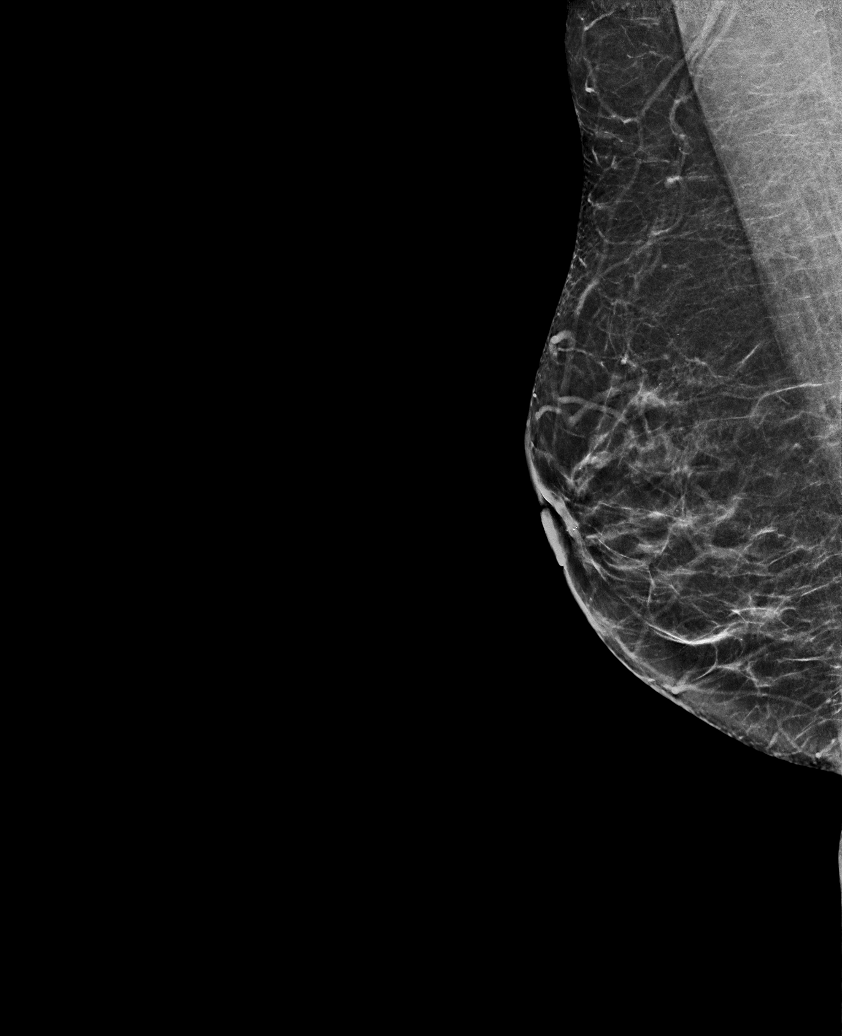

[L MLO tomo · tomo slice 31/60.0]
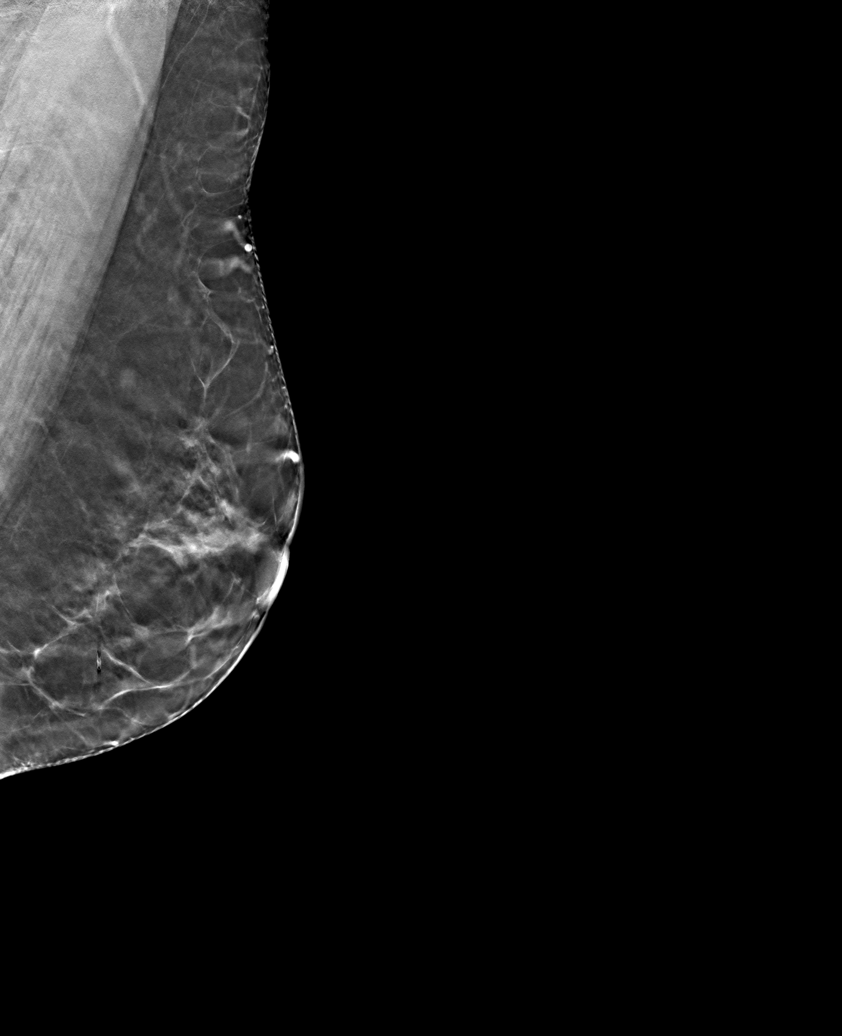

[6 of 30 positions shown; findings below may reference images not displayed]

ACR Breast Density Category b: There are scattered areas of
fibroglandular density.
FINDINGS: There are no findings suspicious for malignancy.
IMPRESSION: No mammographic evidence of malignancy. A result letter of this
screening mammogram will be mailed directly to the patient.

RECOMMENDATION:
Screening mammogram in one year. (Code:51-O-LD2)

BI-RADS CATEGORY  1: Negative.

## 2023-06-14 ENCOUNTER — Other Ambulatory Visit: Payer: Self-pay | Admitting: Internal Medicine

## 2023-06-14 DIAGNOSIS — Z1231 Encounter for screening mammogram for malignant neoplasm of breast: Secondary | ICD-10-CM

## 2023-08-04 ENCOUNTER — Ambulatory Visit
Admission: RE | Admit: 2023-08-04 | Discharge: 2023-08-04 | Disposition: A | Discharge status: Home / Self Care | Payer: Commercial Managed Care - HMO | Source: Ambulatory Visit | Attending: Internal Medicine | Admitting: Internal Medicine

## 2023-08-04 DIAGNOSIS — Z1231 Encounter for screening mammogram for malignant neoplasm of breast: Secondary | ICD-10-CM | POA: Insufficient documentation

## 2023-08-10 ENCOUNTER — Other Ambulatory Visit: Payer: Self-pay | Admitting: Internal Medicine

## 2023-08-10 DIAGNOSIS — R928 Other abnormal and inconclusive findings on diagnostic imaging of breast: Secondary | ICD-10-CM

## 2023-08-13 ENCOUNTER — Ambulatory Visit
Admission: RE | Admit: 2023-08-13 | Discharge: 2023-08-13 | Disposition: A | Discharge status: Home / Self Care | Payer: Commercial Managed Care - HMO | Source: Ambulatory Visit | Attending: Internal Medicine | Admitting: Internal Medicine

## 2023-08-13 DIAGNOSIS — R928 Other abnormal and inconclusive findings on diagnostic imaging of breast: Secondary | ICD-10-CM

## 2023-10-01 ENCOUNTER — Other Ambulatory Visit: Payer: Self-pay | Admitting: Family Medicine

## 2023-10-01 ENCOUNTER — Ambulatory Visit
Admission: RE | Admit: 2023-10-01 | Discharge: 2023-10-01 | Disposition: A | Discharge status: Home / Self Care | Source: Ambulatory Visit | Attending: Family Medicine | Admitting: Family Medicine

## 2023-10-01 DIAGNOSIS — M79605 Pain in left leg: Secondary | ICD-10-CM | POA: Diagnosis present

## 2023-10-01 DIAGNOSIS — R600 Localized edema: Secondary | ICD-10-CM | POA: Diagnosis present

## 2024-02-08 ENCOUNTER — Other Ambulatory Visit: Payer: Self-pay | Admitting: Family Medicine

## 2024-02-08 DIAGNOSIS — M5412 Radiculopathy, cervical region: Secondary | ICD-10-CM

## 2024-02-09 ENCOUNTER — Ambulatory Visit
Admission: RE | Admit: 2024-02-09 | Discharge: 2024-02-09 | Disposition: A | Discharge status: Home / Self Care | Source: Ambulatory Visit | Attending: Family Medicine | Admitting: Family Medicine

## 2024-02-09 DIAGNOSIS — M5412 Radiculopathy, cervical region: Secondary | ICD-10-CM

## 2024-04-12 NOTE — Progress Notes (Signed)
 ORTHOPAEDIC SURGERY- CLINIC NOTE  Chief Complaint: Right hand numbness and tingling  History of Present Illness: History of Present Illness Carla Ellis is a 58 year old female who presents with numbness and tingling in her right hand. She has been experiencing constant numbness and tingling in her right hand for the past 3 to 4 months, specifically affecting her pinky, the side of her hand, and half of her ring finger. She reports a loss of grip strength, impacting her ability to hold objects. The symptoms do not typically disturb her sleep unless she lies on her arm incorrectly, causing it to 'go to sleep' more than usual.  She initially sought treatment from her chiropractor, suspecting a pinched nerve. After six treatments, the chiropractor suggested an entrapped nerve and referred her for further evaluation. An MRI of her neck was performed, followed by a nerve test conducted a week ago.  She has not attempted using a pillow or towel around her elbow at night to alleviate symptoms. She reports no pain associated with the numbness and tingling.  Prior medical records from Dr. Kathlynn and Dr. Maree were reviewed.    PMHx, PSurgHx, Fam Hx, Soc Hx, Meds, Allergies: Past Medical History:  Diagnosis Date  . Anxiety   . B12 deficiency 05/15/2014  . COPD with asthma (CMS/HHS-HCC)   . COPD with asthma (CMS/HHS-HCC)   . Depression   . Diabetes mellitus (CMS/HHS-HCC)   . Generalized osteoarthritis of multiple sites 05/15/2014  . Skin cancer    basal cell; had melonoma 2023    Past Surgical History:  Procedure Laterality Date  . COLONOSCOPY  11/06/2016   Dr. MICAEL Peng @ [facility] - Nml, FHPolyps mother, rpt 5 yrs per provider.  SABRA JOINT REPLACEMENT  03/2018   thumb basel joint  . APPENDECTOMY    . BREAST BIOPSY  2014   no cancer taggegd  . left breast biopsy    . Left Index Finger Cyst Excision 07/11/15 Dr Kathlynn    . skin cancer excisions    . TONSILLECTOMY      Family History   Problem Relation Age of Onset  . Alzheimer's disease Mother   . High blood pressure (Hypertension) Mother        deceased  . Colon polyps Mother   . Skin cancer Mother   . Thyroid  disease Mother   . Lung cancer Father   . Alcohol abuse Father   . Cancer Father        lung cancer  . Melanoma Sister        facial melanoma  . Skin cancer Sister   . No Known Problems Brother     Social History   Socioeconomic History  . Marital status: Married  Tobacco Use  . Smoking status: Former    Current packs/day: 1.00    Average packs/day: 1 pack/day for 5.0 years (5.0 ttl pk-yrs)    Types: Cigarettes    Passive exposure: Past  . Smokeless tobacco: Never  Vaping Use  . Vaping status: Never Used  Substance and Sexual Activity  . Alcohol use: Not Currently    Alcohol/week: 1.0 standard drink of alcohol    Types: 1 Glasses of wine per week    Comment: once in a while  . Drug use: No  . Sexual activity: Not Currently    Partners: Male    Birth control/protection: Post-menopausal  Other Topics Concern  . Would you please tell us  about the people who live in your  home, your pets, or anything else important to your social life? Yes    Comment: Husband diagnosed with Gliablastoma brain tumor in December   Social Drivers of Health   Financial Resource Strain: Low Risk  (04/13/2024)   Overall Financial Resource Strain (CARDIA)   . Difficulty of Paying Living Expenses: Not hard at all  Food Insecurity: No Food Insecurity (04/13/2024)   Hunger Vital Sign   . Worried About Programme Researcher, Broadcasting/film/video in the Last Year: Never true   . Ran Out of Food in the Last Year: Never true  Transportation Needs: No Transportation Needs (04/13/2024)   PRAPARE - Transportation   . Lack of Transportation (Medical): No   . Lack of Transportation (Non-Medical): No  Physical Activity: Sufficiently Active (08/11/2017)   Received [facility]   Exercise Vital Sign   . Days of Exercise per Week: 5 days   .  Minutes of Exercise per Session: 60 min  Stress: No Stress Concern Present (08/11/2017)   Received from [facility] - Occupational Stress Questionnaire   . Feeling of Stress : Only a little  Social Connections: Socially Integrated (08/11/2017)   Received from [facility]   Social Connection and Isolation Panel   . Frequency of Communication with Friends and Family: More than three times a week   . Frequency of Social Gatherings with Friends and Family: Once a week   . Attends Religious Services: More than 4 times per year   . Active Member of Clubs or Organizations: Yes   . Attends Banker Meetings: More than 4 times per year   . Marital Status: Married  Housing Stability: Low Risk  (04/13/2024)   Housing Stability Vital Sign   . Unable to Pay for Housing in the Last Year: No   . Number of Times Moved in the Last Year: 0   . Homeless in the Last Year: No  Recent Concern: Housing Stability - High Risk (02/07/2024)   Housing Stability Vital Sign   . Unable to Pay for Housing in the Last Year: Yes   . Number of Times Moved in the Last Year: 0   . Homeless in the Last Year: No     Current Outpatient Medications  Medication Sig Dispense Refill  . BIOTIN ORAL Take 1 tablet by mouth once daily. One po daily      . cholecalciferol (VITAMIN D3) 1,000 unit capsule Take by mouth. Take 5,000 Units by mouth. Dose    . cyanocobalamin (VITAMIN B12) 1000 MCG tablet Take 1,000 mcg by mouth once daily.    . estradioL  (CLIMARA ) 0.1 mg/24 hr patch Place 1 patch onto the skin once a week 4 patch 11  . fluticasone propionate (FLONASE) 50 mcg/actuation nasal spray Place 1 spray into both nostrils 2 (two) times daily 16 g 0  . magnesium 30 mg tablet Take 30 mg by mouth 2 (two) times daily    . minoxidiL 2.5 MG tablet Take 2.5 mg by mouth once daily    . multivitamin tablet Take 1 tablet by mouth once daily Centrum women's    . progesterone  (PROMETRIUM) 200 MG capsule TAKE 1 CAPSULE BY MOUTH AT BEDTIME. 90 capsule 3  . tretinoin (RETIN-A) 0.025 % cream APPLY A PEA SIZED AMOUNT TO FACE NIGHTLY FOR ACNE    . venlafaxine (EFFEXOR-XR) 37.5 MG XR capsule TAKE 1 CAPSULE BY MOUTH ONCE DAILY 90 capsule 3  . vitamin E 1000  UNIT capsule Take by mouth. Take 1,000 Units by mouth daily.     No current facility-administered medications for this visit.    No Known Allergies  Review of Systems: A 10+ ROS was performed, reviewed, and the pertinent orthopaedic findings are documented in the HPI.  I have reviewed and agree with the ROS captured by the CMA.    Physical Exam: BP 122/80   Ht 167.6 cm (5' 6)   Wt 80.3 kg (177 lb)   LMP 05/21/2017 (LMP Unknown)   BMI 28.57 kg/m  General/Constitutional: No apparent distress: well-nourished and well developed. Eyes: Pupils equal, round with synchronous movement. Lymphatic: No palpable adenopathy. Respiratory: Patient has good chest rise and fall with inspiration and expiration.  All lung fields are clear to auscultation bilaterally.  There is no Rales, rhonchi or wheezes appreciated. Cardiovascular: Upon auscultation there is a regular rate and rhythm without any murmurs, rubs, gallops or heaves appreciated. Integumentary: No impressive skin lesions present, except as noted in detailed exam. Neuro/Psych: Normal mood and affect, oriented to person, place and time. Musculoskeletal: see exam below  Right Upper Extremity:  Positive Tinel's at the elbow, positive flexion compression test at the elbow.  4+ out of 5 finger abduction strength.  4 out of 5 small finger FDP strength.  Mild first dorsal interossei atrophy noted.  Mildly positive Wartenberg's sign.  Mildly positive Froment's sign.  Negative Tinel's at the wrist, negative Durkan's, negative Phalen's, 5 out of 5 thumb palmar abduction strength.  Sensation decreased to light touch to the small finger and ulnar half of the ring finger compared to  the other digits.  Digits are warm and well-perfused.  Imaging and Results: I personally reviewed the electrodiagnostic testing performed on 04/03/2024: Evaluation of the Right median motor and the Right ulnar motor nerves showed reduced amplitude (R1.7, R4.3 mV).  All remaining nerves (as indicated in the following tables) were within normal limits.   EMG    Side Muscle Nerve Root Ins Act Fibs Psw Amp Dur Poly Recrt Int Bruna Comment  Right Abd Poll Brev Median C8-T1 Nml Nml Nml Nml Nml 0 Nml Nml    Right ABD Dig Min Ulnar C8-T1 Nml Nml Nml Incr >62ms 1+ mild Reduced Nml    Right FlexCarpiUln Ulnar C8,T1 Nml Nml Nml Incr >65ms 1+ mild Reduced Nml    Right 1stDorInt Ulnar C8-T1 Nml Nml Nml Nml Nml 0 Nml Nml    Right ExtDigCom Radial (Post Int) C7-8 Nml Nml Nml Nml Nml 0 Nml Nml    Right PronatorTeres Median C6-7 Nml Nml Nml Nml Nml 0 Nml Nml    Right Biceps Musculocut C5-6 Nml Nml Nml Nml Nml 0 Nml Nml    Right Triceps Radial C6-7-8 Nml Nml Nml Nml Nml 0 Nml Nml    Right Cervical Parasp Up Rami C1-3 Nml Nml Nml              Right Cervical Parasp Mid Rami C4-6 Nml Nml Nml              Right Cervical Parasp Low Rami C7-8 Nml Nml Nml                Impression: This is an abnormal electrodiagnostic study consistent with 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   Assessment & Plan: Assessment & Plan Right cubital tunnel syndrome - Schedule cubital tunnel release surgery to relieve nerve pressure and prevent further muscle atrophy. - Discussed  surgical procedure, incision location, and tissue release. - Discussed surgical risks: infection, injury to nearby structures, incomplete symptom relief, recurrence, elbow stiffness, hematoma, chronic pain syndromes - Advised on post-operative expectations - Will follow-up postoperatively  Jackquline CANDIE Barrack, MD [facility] [address] [place],  [place] [address] Phone: [phone]  This note was generated in part with voice recognition software; please excuse any typographical errors that were not detected and corrected. This note has been created using automated tools and reviewed for accuracy by [facility].

## 2024-06-07 ENCOUNTER — Other Ambulatory Visit: Payer: Self-pay

## 2024-06-07 NOTE — H&P (View-Only) (Signed)
 "   ORTHOPAEDIC SURGERY- CLINIC NOTE  Chief Complaint: Right hand numbness and tingling  History of Present Illness:  Carla Ellis is a 58 y.o. female who presents today for follow-up of the above.  She has been experiencing constant numbness and tingling to her right hand for the past 3 to 4 months specifically impacting her small finger and the ulnar aspect of the ring finger.  She feels that she has lost some grip strength impacting her ability to hold objects.  This sometimes wakes her up from sleep.  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  Previous medical records from Dr. Kathlynn and Dr. Loreli were reviewed.  Today,  Her symptoms are the same.  She still has persistent numbness involving the small finger and ulnar aspect of the ring finger.  PMHx, PSurgHx, Fam Hx, Soc Hx, Meds, Allergies: Past Medical History:  Diagnosis Date   Anxiety    B12 deficiency 05/15/2014   COPD with asthma (CMS/HHS-HCC)    COPD with asthma (CMS/HHS-HCC)    Depression    Diabetes mellitus (CMS/HHS-HCC)    Generalized osteoarthritis of multiple sites 05/15/2014   Skin cancer    basal cell; had melonoma 2023    Past Surgical History:  Procedure Laterality Date   COLONOSCOPY  11/06/2016   Dr. MICAEL Peng @ [facility] - Nml, FHPolyps mother, rpt 5 yrs per provider.   JOINT REPLACEMENT  03/2018   thumb basel joint   APPENDECTOMY     BREAST BIOPSY  2014   no cancer taggegd   left breast biopsy     Left Index Finger Cyst Excision 07/11/15 Dr Kathlynn     skin cancer excisions     TONSILLECTOMY      Family History  Problem Relation Age of Onset   Alzheimer's disease Mother    High blood pressure (Hypertension) Mother        deceased   Colon polyps Mother    Skin cancer Mother    Thyroid  disease Mother    Lung cancer Father    Alcohol abuse Father    Cancer Father        lung cancer   Melanoma Sister        facial melanoma    Skin cancer Sister    No Known Problems Brother     Social History   Socioeconomic History   Marital status: Married  Tobacco Use   Smoking status: Former    Current packs/day: 1.00    Average packs/day: 1 pack/day for 5.0 years (5.0 ttl pk-yrs)    Types: Cigarettes    Passive exposure: Past   Smokeless tobacco: Never  Vaping Use   Vaping status: Never Used  Substance and Sexual Activity   Alcohol use: Not Currently    Alcohol/week: 1.0 standard drink of alcohol    Types: 1 Glasses of wine per week    Comment: once in a while   Drug use: No   Sexual activity: Not Currently    Partners: Male    Birth control/protection: Post-menopausal  Other Topics Concern   Would you please tell us  about the people who live in your home, your pets, or anything else important to your social life? Yes    Comment: Husband diagnosed with Gliablastoma brain tumor in December   Social Drivers of Health   Financial Resource Strain: Low Risk  (06/07/2024)   Overall Financial Resource Strain (CARDIA)    Difficulty of Paying Living Expenses:  Not hard at all  Food Insecurity: No Food Insecurity (06/07/2024)   Hunger Vital Sign    Worried About Running Out of Food in the Last Year: Never true    Ran Out of Food in the Last Year: Never true  Transportation Needs: No Transportation Needs (06/07/2024)   PRAPARE - Administrator, Civil Service (Medical): No    Lack of Transportation (Non-Medical): No  Physical Activity: Sufficiently Active (08/11/2017)   Received from [facility]   Exercise Vital Sign    Days of Exercise per Week: 5 days    Minutes of Exercise per Session: 60 min  Stress: No Stress Concern Present (08/11/2017)   Received from [facility] - Occupational Stress Questionnaire    Feeling of Stress : Only a little  Social Connections: Socially Integrated (08/11/2017)   Received from [facility]   Social Connection  and Isolation Panel    Frequency of Communication with Friends and Family: More than three times a week    Frequency of Social Gatherings with Friends and Family: Once a week    Attends Religious Services: More than 4 times per year    Active Member of [employer] or Organizations: Yes    Attends Engineer, Structural: More than 4 times per year    Marital Status: Married  Housing Stability: Low Risk  (06/07/2024)   Housing Stability Vital Sign    Unable to Pay for Housing in the Last Year: No    Number of Times Moved in the Last Year: 0    Homeless in the Last Year: No     Current Outpatient Medications  Medication Sig Dispense Refill   BIOTIN ORAL Take 1 tablet by mouth once daily. One po daily       cholecalciferol (VITAMIN D3) 1,000 unit capsule Take by mouth. Take 5,000 Units by mouth. Dose     cyanocobalamin (VITAMIN B12) 1000 MCG tablet Take 1,000 mcg by mouth once daily.     fluticasone propionate (FLONASE) 50 mcg/actuation nasal spray Place 1 spray into both nostrils 2 (two) times daily 16 g 0   magnesium 30 mg tablet Take 30 mg by mouth 2 (two) times daily     minoxidiL 2.5 MG tablet Take 2.5 mg by mouth once daily     multivitamin tablet Take 1 tablet by mouth once daily Centrum women's     progesterone (PROMETRIUM) 200 MG capsule TAKE 1 CAPSULE BY MOUTH AT BEDTIME. 90 capsule 3   tretinoin (RETIN-A) 0.025 % cream APPLY A PEA SIZED AMOUNT TO FACE NIGHTLY FOR ACNE     venlafaxine (EFFEXOR-XR) 37.5 MG XR capsule TAKE 1 CAPSULE BY MOUTH ONCE DAILY 90 capsule 3   vitamin E 1000 UNIT capsule Take by mouth. Take 1,000 Units by mouth daily.     estradioL  (CLIMARA ) 0.1 mg/24 hr patch Place 1 patch onto the skin once a week 4 patch 11   No current facility-administered medications for this visit.    No Known Allergies  Review of Systems: A 10+ ROS was performed, reviewed, and the pertinent orthopaedic findings are documented in the HPI.  I have reviewed  and agree with the ROS captured by the CMA.    Physical Exam: BP 130/80   Ht 167.6 cm (5' 6)   Wt 79.5 kg (175 lb 3.2 oz)   LMP 05/21/2017 (LMP Unknown)   BMI 28.28 kg/m  General/Constitutional: No apparent distress: well-nourished and well  developed. Eyes: Pupils equal, round with synchronous movement. Lymphatic: No palpable adenopathy. Respiratory: Patient has good chest rise and fall with inspiration and expiration.  All lung fields are clear to auscultation bilaterally.  There is no Rales, rhonchi or wheezes appreciated. Cardiovascular: Upon auscultation there is a regular rate and rhythm without any murmurs, rubs, gallops or heaves appreciated. Integumentary: No impressive skin lesions present, except as noted in detailed exam. Neuro/Psych: Normal mood and affect, oriented to person, place and time. Musculoskeletal: see exam below  Right Upper Extremity: Positive Tinel's, flexion compression at the elbow.  4+/5 finger abduction strength.  No obvious interossei atrophy.  Mildly positive Froment's sign.  Negative Wartenberg's sign.  Able to make a full composite fist.  Able to fully extend all of the digits.  Sensation decreased to light touch to the small finger and ulnar aspect of the ring finger.  Digits are warm and well perfused.  Imaging and Results: I personally interpreted the electrodiagnostic testing performed on 04/03/2024: Evaluation of the Right median motor and the Right ulnar motor nerves showed reduced amplitude (R1.7, R4.3 mV).  All remaining nerves (as indicated in the following tables) were within normal limits.   EMG    Side Muscle Nerve Root Ins Act Fibs Psw Amp Dur Poly Recrt Int Bruna Comment  Right Abd Poll Brev Median C8-T1 Nml Nml Nml Nml Nml 0 Nml Nml    Right ABD Dig Min Ulnar C8-T1 Nml Nml Nml Incr >82ms 1+ mild Reduced Nml    Right FlexCarpiUln Ulnar C8,T1 Nml Nml Nml Incr >44ms 1+ mild Reduced Nml    Right 1stDorInt Ulnar C8-T1 Nml Nml Nml Nml Nml 0 Nml  Nml    Right ExtDigCom Radial (Post Int) C7-8 Nml Nml Nml Nml Nml 0 Nml Nml    Right PronatorTeres Median C6-7 Nml Nml Nml Nml Nml 0 Nml Nml    Right Biceps Musculocut C5-6 Nml Nml Nml Nml Nml 0 Nml Nml    Right Triceps Radial C6-7-8 Nml Nml Nml Nml Nml 0 Nml Nml    Right Cervical Parasp Up Rami C1-3 Nml Nml Nml              Right Cervical Parasp Mid Rami C4-6 Nml Nml Nml              Right Cervical Parasp Low Rami C7-8 Nml Nml Nml                Impression: This is an abnormal electrodiagnostic study consistent with 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   Assessment & Plan: Carla Ellis is a 58 y.o. female patient with right cubital tunnel syndrome - We will plan on surgical release of the cubital tunnel; the risk, benefits, alternatives to surgery were discussed with the patient - We discussed the expected postoperative course and improvement given that she has already developed constant numbness as well as evidence of weakness on exam; our hope is to prevent the further progression of the nerve injury and any improvement to symptoms are not guaranteed - Risks discussed include but were not limited to infection, injury to nearby structures occluding the nerve itself, incomplete relief of symptoms, needing a revision surgery, chronic pain syndrome, stiffness, hematoma - The patient will follow-up postoperatively   Jackquline CANDIE Barrack, MD [facility] [address] [place], [place] [address] Phone: [phone]  This note was generated in part with voice recognition software; please  excuse any typographical errors that were not detected and corrected.  "

## 2024-06-12 ENCOUNTER — Other Ambulatory Visit: Payer: Self-pay

## 2024-06-12 ENCOUNTER — Encounter
Admission: RE | Admit: 2024-06-12 | Discharge: 2024-06-12 | Disposition: A | Discharge status: Home / Self Care | Source: Ambulatory Visit

## 2024-06-12 HISTORY — DX: Unspecified asthma, uncomplicated: J45.909

## 2024-06-12 NOTE — Patient Instructions (Addendum)
 Your procedure is scheduled on: Tuesday 06/20/24 Report to the Registration Desk on the 1st floor of the [facility]. To find out your arrival time, please call [phone] between 1PM - 3PM on: Monday 06/19/24 If your arrival time is 6:00 am, do not arrive before that time as the [facility] entrance doors do not open until 6:00 am.  REMEMBER: Instructions that are not followed completely may result in serious medical risk, up to and including death; or upon the discretion of your surgeon and anesthesiologist your surgery may need to be rescheduled.  Do not eat food after midnight the night before surgery.  No gum chewing or hard candies.  You may however, drink CLEAR liquids up to 2 hours before you are scheduled to arrive for your surgery. Do not drink anything within 2 hours of your scheduled arrival time.  Clear liquids include: - water  - apple juice without pulp - gatorade (not RED colors) - black coffee or tea (Do NOT add milk or creamers to the coffee or tea) Do NOT drink anything that is not on this list.  In addition, your doctor has ordered for you to drink the provided:  Ensure Pre-Surgery Clear Carbohydrate Drink  Drinking this carbohydrate drink up to two hours before surgery helps to reduce insulin resistance and improve patient outcomes. Please complete drinking 2 hours before scheduled arrival time.  One week prior to surgery: Stop Anti-inflammatories (NSAIDS) such as Advil, Aleve, Ibuprofen, Motrin, Naproxen, Naprosyn and Aspirin based products such as Excedrin, Goody's Powder, BC Powder. Stop ANY OVER THE COUNTER supplements until after surgery. BIOTIN  Cyanocobalamin (VITAMIN B 12  VITAMIN E  Multiple Vitamin (MULTIVITAMIN WITH MINERALS)  MAGNESIUM   You may however, continue to take Tylenol  if needed for pain up until the day of surgery.  Continue taking all of your other prescription medications up until the day of surgery.  ON THE DAY OF SURGERY ONLY  TAKE THESE MEDICATIONS WITH SIPS OF WATER:  venlafaxine XR (EFFEXOR-XR) 37.5 Mg (if needed) famotidine  (PEPCID ) 10 MG    No Alcohol for 24 hours before or after surgery.  No Smoking including e-cigarettes for 24 hours before surgery.  No chewable tobacco products for at least 6 hours before surgery.  No nicotine patches on the day of surgery.  Do not use any recreational drugs for at least a week (preferably 2 weeks) before your surgery.  Please be advised that the combination of cocaine and anesthesia may have negative outcomes, up to and including death. If you test positive for cocaine, your surgery will be cancelled.  On the morning of surgery brush your teeth with toothpaste and water, you may rinse your mouth with mouthwash if you wish. Do not swallow any toothpaste or mouthwash.  Use CHG Soap or wipes as directed on instruction sheet.  Do not wear jewelry, make-up, hairpins, clips or nail polish.  For welded (permanent) jewelry: bracelets, anklets, waist bands, etc.  Please have this removed prior to surgery.  If it is not removed, there is a chance that hospital personnel will need to cut it off on the day of surgery.  Do not wear lotions, powders, or perfumes.   Do not shave body hair from the neck down 48 hours before surgery.  Contact lenses, hearing aids and dentures may not be worn into surgery.  Do not bring valuables to the hospital. [facility] is not responsible for any missing/lost belongings or valuables.   Notify your doctor if  there is any change in your medical condition (cold, fever, infection).  Wear comfortable clothing (specific to your surgery type) to the hospital.  After surgery, you can help prevent lung complications by doing breathing exercises.  Take deep breaths and cough every 1-2 hours. Your doctor may order a device called an Incentive Spirometer to help you take deep breaths. When coughing or sneezing, hold a pillow firmly against your  incision with both hands. This is called splinting. Doing this helps protect your incision. It also decreases belly discomfort.  If you are being admitted to the hospital overnight, leave your suitcase in the car. After surgery it may be brought to your room.  In case of increased patient census, it may be necessary for you, the patient, to continue your postoperative care in the Same Day Surgery department.  If you are being discharged the day of surgery, you will not be allowed to drive home. You will need a responsible individual to drive you home and stay with you for 24 hours after surgery.   If you are taking public transportation, you will need to have a responsible individual with you.  Please call the Pre-admissions Testing Dept. at [phone] if you have any questions about these instructions.  Surgery Visitation Policy:  Patients having surgery or a procedure may have two visitors.  Children under the age of 5 must have an adult with them who is not the patient.  Inpatient Visitation:    Visiting hours are 7 a.m. to 8 p.m. Up to four visitors are allowed at one time in a patient room. The visitors may rotate out with other people during the day.  One visitor age 74 or older may stay with the patient overnight and must be in the room by 8 p.m.   Merchandiser, Retail to address health-related social needs:  [email or web address]                                                                                                             Preparing for Surgery with CHLORHEXIDINE  GLUCONATE (CHG) Soap  Chlorhexidine  Gluconate (CHG) Soap  o An antiseptic cleaner that kills germs and bonds with the skin to continue killing germs even after washing  o Used for showering the night before surgery and morning of surgery  Before surgery, you can play an important role by reducing the number of germs on your skin.  CHG (Chlorhexidine  gluconate) soap is an  antiseptic cleanser which kills germs and bonds with the skin to continue killing germs even after washing.  Please do not use if you have an allergy to CHG or antibacterial soaps. If your skin becomes reddened/irritated stop using the CHG.  1. Shower the NIGHT BEFORE SURGERY with CHG soap.  2. If you choose to wash your hair, wash your hair first as usual with your normal shampoo.  3. After shampooing, rinse your hair and body thoroughly to remove the shampoo.  4. Use CHG as you would any other liquid soap. You can apply CHG directly  to the skin and wash gently with a clean washcloth.  5. Apply the CHG soap to your body only from the neck down. Do not use on open wounds or open sores. Avoid contact with your eyes, ears, mouth, and genitals (private parts). Wash face and genitals (private parts) with your normal soap.  6. Wash thoroughly, paying special attention to the area where your surgery will be performed.  7. Thoroughly rinse your body with warm water.  8. Do not shower/wash with your normal soap after using and rinsing off the CHG soap.  9. Do not use lotions, oils, etc., after showering with CHG.  10. Pat yourself dry with a clean towel.  11. Wear clean pajamas to bed the night before surgery.  12. Place clean sheets on your bed the night of your shower and do not sleep with pets.  13. Do not apply any deodorants/lotions/powders.  14. Please wear clean clothes to the hospital.  15. Remember to brush your teeth with your regular toothpaste.

## 2024-06-19 NOTE — Anesthesia Preprocedure Evaluation (Signed)
"                                    Anesthesia Evaluation  Patient identified by MRN, date of birth, ID band Patient awake    Reviewed: Allergy & Precautions, H&P , NPO status , Patient's Chart, lab work & pertinent test results  Airway Mallampati: III  TM Distance: >3 FB Neck ROM: full    Dental no notable dental hx.    Pulmonary former smoker   Pulmonary exam normal        Cardiovascular negative cardio ROS Normal cardiovascular exam     Neuro/Psych   Anxiety Depression    negative neurological ROS  negative psych ROS   GI/Hepatic negative GI ROS, Neg liver ROS,,,  Endo/Other  negative endocrine ROS    Renal/GU negative Renal ROS  negative genitourinary   Musculoskeletal   Abdominal   Peds  Hematology negative hematology ROS (+)   Anesthesia Other Findings Past Medical History: No date: Anxiety with depression No date: Asthma No date: Basal cell carcinoma     Comment:  chest, back, arm, leg, shoulder 2014: Breast mass     Comment:  left breast negative biopsy No date: Menopausal symptoms No date: Osteoarthritis 2015: Vitamin B12 deficiency  Past Surgical History: No date: APPENDECTOMY 12/2012: BREAST BIOPSY; Left     Comment:  core- fibroadeoma/clip No date: CRYOTHERAPY     Comment:  in her 53s normal paps since 07/11/2015: GANGLION CYST EXCISION; Left     Comment:  Procedure: EXCISION OF MUCOUS CYST LEFT INDEX FINGER;                Surgeon: Ozell Flake, MD;  Location: [facility] ORS;  Service:              Orthopedics;  Laterality: Left; No date: joint replaced on Left hand  thumb     Reproductive/Obstetrics negative OB ROS                              Anesthesia Physical Anesthesia Plan  ASA: 2  Anesthesia Plan: General   Post-op Pain Management: Tylenol  PO (pre-op)*, Celebrex  PO (pre-op)* and Regional block*   Induction: Intravenous  PONV Risk Score and Plan: Propofol  infusion and TIVA  Airway  Management Planned: Natural Airway  Additional Equipment:   Intra-op Plan:   Post-operative Plan:   Informed Consent: I have reviewed the patients History and Physical, chart, labs and discussed the procedure including the risks, benefits and alternatives for the proposed anesthesia with the patient or authorized representative who has indicated his/her understanding and acceptance.     Dental Advisory Given  Plan Discussed with: CRNA and Surgeon  Anesthesia Plan Comments:          Anesthesia Quick Evaluation  "

## 2024-06-20 ENCOUNTER — Encounter: Admission: RE | Disposition: A | Discharge status: Home / Self Care | Payer: Self-pay | Source: Home / Self Care

## 2024-06-20 ENCOUNTER — Ambulatory Visit

## 2024-06-20 ENCOUNTER — Encounter: Payer: Self-pay | Admitting: Anesthesiology

## 2024-06-20 ENCOUNTER — Ambulatory Visit: Payer: Self-pay | Admitting: Anesthesiology

## 2024-06-20 ENCOUNTER — Other Ambulatory Visit: Payer: Self-pay

## 2024-06-20 ENCOUNTER — Ambulatory Visit
Admission: RE | Admit: 2024-06-20 | Discharge: 2024-06-20 | Disposition: A | Discharge status: Home / Self Care | Source: Home / Self Care

## 2024-06-20 DIAGNOSIS — F419 Anxiety disorder, unspecified: Secondary | ICD-10-CM | POA: Insufficient documentation

## 2024-06-20 DIAGNOSIS — F32A Depression, unspecified: Secondary | ICD-10-CM | POA: Insufficient documentation

## 2024-06-20 DIAGNOSIS — G5621 Lesion of ulnar nerve, right upper limb: Secondary | ICD-10-CM | POA: Diagnosis present

## 2024-06-20 DIAGNOSIS — Z87891 Personal history of nicotine dependence: Secondary | ICD-10-CM | POA: Insufficient documentation

## 2024-06-20 HISTORY — PX: ULNAR TUNNEL RELEASE: SHX820

## 2024-06-20 SURGERY — RELEASE, CUBITAL TUNNEL
Anesthesia: General | Site: Elbow | Laterality: Right

## 2024-06-20 MED ORDER — OXYCODONE HCL 5 MG/5ML PO SOLN: 5.0000 mg | Freq: Once | ORAL | Status: DC | PRN

## 2024-06-20 MED ORDER — ORAL CARE MOUTH RINSE: 15.0000 mL | Freq: Once | OROMUCOSAL | Status: AC

## 2024-06-20 MED ORDER — PHENYLEPHRINE 80 MCG/ML (10ML) SYRINGE FOR IV PUSH (FOR BLOOD PRESSURE SUPPORT)
PREFILLED_SYRINGE | INTRAVENOUS | Status: AC
  Filled 2024-06-20: qty 10

## 2024-06-20 MED ORDER — FENTANYL CITRATE (PF) 50 MCG/ML IJ SOSY
PREFILLED_SYRINGE | INTRAMUSCULAR | Status: AC
  Filled 2024-06-20: qty 1

## 2024-06-20 MED ORDER — CELECOXIB 200 MG PO CAPS
ORAL_CAPSULE | ORAL | Status: AC
  Filled 2024-06-20: qty 1

## 2024-06-20 MED ORDER — OXYCODONE HCL 5 MG PO TABS: 5.0000 mg | ORAL_TABLET | Freq: Once | ORAL | Status: DC | PRN

## 2024-06-20 MED ORDER — BUPIVACAINE HCL (PF) 0.5 % IJ SOLN
INTRAMUSCULAR | Status: DC | PRN
  Administered 2024-06-20: 20 mL via PERINEURAL

## 2024-06-20 MED ORDER — PHENYLEPHRINE 80 MCG/ML (10ML) SYRINGE FOR IV PUSH (FOR BLOOD PRESSURE SUPPORT)
PREFILLED_SYRINGE | INTRAVENOUS | Status: DC | PRN
  Administered 2024-06-20 (×6): 80 ug via INTRAVENOUS

## 2024-06-20 MED ORDER — PROPOFOL 500 MG/50ML IV EMUL
INTRAVENOUS | Status: DC | PRN
  Administered 2024-06-20: 20 mg via INTRAVENOUS
  Administered 2024-06-20: 40 mg via INTRAVENOUS
  Administered 2024-06-20: 150 ug/kg/min via INTRAVENOUS

## 2024-06-20 MED ORDER — CHLORHEXIDINE GLUCONATE 0.12 % MT SOLN
OROMUCOSAL | Status: AC
  Filled 2024-06-20: qty 15

## 2024-06-20 MED ORDER — ONDANSETRON HCL 4 MG/2ML IJ SOLN
INTRAMUSCULAR | Status: DC | PRN
  Administered 2024-06-20: 4 mg via INTRAVENOUS

## 2024-06-20 MED ORDER — ACETAMINOPHEN 500 MG PO TABS
ORAL_TABLET | ORAL | Status: AC
  Filled 2024-06-20: qty 2

## 2024-06-20 MED ORDER — CELECOXIB 200 MG PO CAPS
200.0000 mg | ORAL_CAPSULE | Freq: Once | ORAL | Status: AC
  Administered 2024-06-20: 200 mg via ORAL

## 2024-06-20 MED ORDER — ACETAMINOPHEN 500 MG PO TABS
1000.0000 mg | ORAL_TABLET | Freq: Once | ORAL | Status: AC
  Administered 2024-06-20: 1000 mg via ORAL

## 2024-06-20 MED ORDER — PROPOFOL 10 MG/ML IV BOLUS
INTRAVENOUS | Status: AC
  Filled 2024-06-20: qty 20

## 2024-06-20 MED ORDER — MIDAZOLAM HCL 2 MG/2ML IJ SOLN
INTRAMUSCULAR | Status: AC
  Filled 2024-06-20: qty 2

## 2024-06-20 MED ORDER — LACTATED RINGERS IV SOLN: INTRAVENOUS | Status: DC

## 2024-06-20 MED ORDER — 0.9 % SODIUM CHLORIDE (POUR BTL) OPTIME
TOPICAL | Status: DC | PRN
  Administered 2024-06-20: 500 mL

## 2024-06-20 MED ORDER — DROPERIDOL 2.5 MG/ML IJ SOLN: 0.6250 mg | Freq: Once | INTRAMUSCULAR | Status: DC | PRN

## 2024-06-20 MED ORDER — PROPOFOL 1000 MG/100ML IV EMUL
INTRAVENOUS | Status: AC
  Filled 2024-06-20: qty 100

## 2024-06-20 MED ORDER — FENTANYL CITRATE (PF) 100 MCG/2ML IJ SOLN: 25.0000 ug | INTRAMUSCULAR | Status: DC | PRN

## 2024-06-20 MED ORDER — CHLORHEXIDINE GLUCONATE 0.12 % MT SOLN
15.0000 mL | Freq: Once | OROMUCOSAL | Status: AC
  Administered 2024-06-20: 15 mL via OROMUCOSAL

## 2024-06-20 MED ORDER — BUPIVACAINE HCL (PF) 0.5 % IJ SOLN
INTRAMUSCULAR | Status: AC
  Filled 2024-06-20: qty 20

## 2024-06-20 MED ORDER — DEXMEDETOMIDINE HCL IN NACL 80 MCG/20ML IV SOLN
INTRAVENOUS | Status: DC | PRN
  Administered 2024-06-20 (×2): 4 ug via INTRAVENOUS

## 2024-06-20 MED ORDER — MIDAZOLAM HCL (PF) 2 MG/2ML IJ SOLN
INTRAMUSCULAR | Status: DC | PRN
  Administered 2024-06-20: 2 mg via INTRAVENOUS

## 2024-06-20 MED ORDER — FENTANYL CITRATE (PF) 100 MCG/2ML IJ SOLN
INTRAMUSCULAR | Status: DC | PRN
  Administered 2024-06-20: 50 ug via INTRAVENOUS

## 2024-06-20 MED ORDER — ACETAMINOPHEN 10 MG/ML IV SOLN: 1000.0000 mg | Freq: Once | INTRAVENOUS | Status: DC | PRN

## 2024-06-20 MED ORDER — OXYCODONE HCL 5 MG PO TABS: 5.0000 mg | ORAL_TABLET | Freq: Three times a day (TID) | ORAL | 0 refills | Status: AC | PRN

## 2024-06-20 MED ORDER — ONDANSETRON HCL 4 MG/2ML IJ SOLN
INTRAMUSCULAR | Status: AC
  Filled 2024-06-20: qty 2

## 2024-06-20 SURGICAL SUPPLY — 21 items
BLADE MINI RND TIP GREEN BEAV (BLADE) IMPLANT
BNDG ELASTIC 4X5.8 VLCR NS LF (GAUZE/BANDAGES/DRESSINGS) IMPLANT
BNDG ESMARCH 4X12 STRL LF (GAUZE/BANDAGES/DRESSINGS) IMPLANT
CHLORAPREP W/TINT 26 (MISCELLANEOUS) ×1 IMPLANT
CORD BIP STRL DISP 12FT (MISCELLANEOUS) IMPLANT
CUFF TOURN SGL QUICK 18X4 (TOURNIQUET CUFF) ×1 IMPLANT
DRAPE FLUOR MINI C-ARM 54X84 (DRAPES) IMPLANT
DRSG OPSITE POSTOP 3X4 (GAUZE/BANDAGES/DRESSINGS) IMPLANT
DRSG XEROFORM 1X8 (GAUZE/BANDAGES/DRESSINGS) IMPLANT
GAUZE SPONGE 4X4 12PLY STRL (GAUZE/BANDAGES/DRESSINGS) IMPLANT
GLOVE BIO SURGEON STRL SZ7.5 (GLOVE) ×1 IMPLANT
GLOVE BIOGEL PI IND STRL 8 (GLOVE) ×1 IMPLANT
GOWN SRG XL LVL 3 NONREINFORCE (GOWNS) ×1 IMPLANT
NDL KEITH SZ2.5 (NEEDLE) IMPLANT
NS IRRIG 500ML POUR BTL (IV SOLUTION) ×1 IMPLANT
PACK EXTREMITY ARMC (MISCELLANEOUS) ×1 IMPLANT
PADDING CAST BLEND 3X4 STRL (MISCELLANEOUS) IMPLANT
SLING ARM MEDIUM (SOFTGOODS) IMPLANT
SUT VIC AB 3-0 FS2 27 (SUTURE) IMPLANT
SUTURE ETHLN 4-0 PC3 18XMF (SUTURE) ×1 IMPLANT
WIRE Z .045 C-WIRE SPADE TIP (WIRE) IMPLANT

## 2024-06-20 NOTE — Anesthesia Procedure Notes (Signed)
 Anesthesia Regional Block: Supraclavicular block   Pre-Anesthetic Checklist: , timeout performed,  Correct Patient, Correct Site, Correct Laterality,  Correct Procedure, Correct Position, site marked,  Risks and benefits discussed,  Surgical consent,  Pre-op evaluation,  At surgeon's request and post-op pain management  Laterality: Upper and Right  Prep: chloraprep       Needles:  Injection technique: Single-shot  Needle Type: Stimiplex     Needle Length: 9cm  Needle Gauge: 22     Additional Needles:   Procedures:,,,, ultrasound used (permanent image in chart),,    Narrative:  Start time: 06/20/2024 1:08 PM End time: 06/20/2024 1:12 PM Injection made incrementally with aspirations every 5 mL.  Performed by: Personally  Anesthesiologist: Vicci Camellia Glatter, MD  Additional Notes: Patient consented for risk and benefits of nerve block including but not limited to nerve damage, failed block, bleeding and infection.  Patient voiced understanding.  Functioning IV was confirmed and monitors were applied.  Timeout done prior to procedure and prior to any sedation being given to the patient.  Patient confirmed procedure site prior to any sedation given to the patient. Sterile prep,hand hygiene and sterile gloves were used.  Minimal sedation used for procedure.  No paresthesia endorsed by patient during the procedure.  Negative aspiration and negative test dose prior to incremental administration of local anesthetic. The patient tolerated the procedure well with no immediate complications.

## 2024-06-20 NOTE — Interval H&P Note (Signed)
 History and Physical Interval Note:  06/20/2024 1:45 PM  Carla Ellis  has presented today for surgery, with the diagnosis of Cubital tunnel syndrome on right G56.21.  The various methods of treatment have been discussed with the patient and family. After consideration of risks, benefits and other options for treatment, the patient has consented to  Procedures: RELEASE, CUBITAL TUNNEL (Right) as a surgical intervention.  The patient's history has been reviewed, patient examined, no change in status, stable for surgery.  I have reviewed the patient's chart and labs.  Questions were answered to the patient's satisfaction.     Jackquline GORMAN Barrack

## 2024-06-20 NOTE — Discharge Instructions (Signed)
 Discharge Instructions After Surgery   Diet   Return to your regular diet as tolerated.   Dressings   You may remove your dressings after 3-4 days.  Okay to shower at this point but do not soak in water (baths, swimming, etc).  It is okay to have a small spot of blood on your dressing as long as it does not keep getting bigger.   In the first 3-4 days, cover the dressing and keep it dry in the shower or bath. Keep your arm up and out of the water.   Do not put any ointments, creams, alcohol , or hydrogen peroxide on your incisions.  Activity   If your fingers are free, work on making a full fist and straightening out the fingers within the limits of your dressing.  Move other joints such as your elbow or shoulder multiple times a day to prevent getting stiff.  No lifting, carrying, pushing, or pulling with your arm until your follow-up visit. You may use your hand or fingers for simple things like writing, typing, eating, and brushing your teeth.   If you were given a sling, wear it while your hand or arm is numb. You can stop wearing the sling when you have feeling and strength back in your hand or arm. You may also use the sling as needed to elevate, protect, or support your arm until your follow-up visit.   Pain   The first 48 hours after surgery can hurt the most. You should use the different types of pain medicine along with rest and elevation to help keep the pain manageable.   NSAIDS such as ibuprofen (Advil or Motrin) or naproxen  (Aleve ) can help with pain and swelling.   Acetaminophen  (Tylenol ) can be used with NSAIDs. Do not take more than 3000 mg of Tylenol  in a 24-hour period.   Narcotic pain medicine such as hydrocodone  or oxycodone , should be used as prescribed. Stop taking as soon as possible. Take with food to help prevent nausea. Do not drink alcohol  or drive while taking narcotics. Be aware that hydrocodone  tablets often already contain acetaminophen  (Tylenol ).   Elevating your operative extremity above the level of your heart will help with pain and swelling. You can rest your arm on several pillows for support while sleeping or resting.   Constipation   Narcotic pain medicines, changes in eating and drinking, and less activity may cause constipation after surgery.   Over-the-counter stool softeners like docusate (Colace), Senna, or Miralax can help. Follow the directions on the bottle.   Stay hydrated and try to eat high fiber foods.   When to call your surgeon   If your dressing gets wet or very dirty.   Fever more than 101 F.   Incision that is very red, swollen, draining pus, or feels hot.   Severe pain, not getting better with pain medicine.   Severe swelling, even with elevation.   If your fingers become cool, cold, or dark in color.   Bleeding that is getting bigger or soaking through the dressing.   Severe nausea and vomiting.   Problems using the bathroom or no bowel movement for 2-3 days.   For questions or concerns, please call [phone].  Jackquline CANDIE Barrack, MD Orthopaedic Surgery [facility]

## 2024-06-20 NOTE — Transfer of Care (Signed)
 Immediate Anesthesia Transfer of Care Note  Patient: Carla Ellis  Procedure(s) Performed: RELEASE, CUBITAL TUNNEL (Right: Elbow)  Patient Location: PACU  Anesthesia Type:MAC  Level of Consciousness: drowsy and patient cooperative  Airway & Oxygen Therapy: Patient Spontanous Breathing  Post-op Assessment: Report given to RN and Post -op Vital signs reviewed and stable  Post vital signs: Reviewed and unstable  Last Vitals:  Vitals Value Taken Time  BP 99/60 06/20/24 14:40  Temp    Pulse 79 06/20/24 14:43  Resp 12 06/20/24 14:43  SpO2 93 % 06/20/24 14:43  Vitals shown include unfiled device data.  Last Pain:  Vitals:   06/20/24 1310  TempSrc:   PainSc: 0-No pain         Complications: No notable events documented.

## 2024-06-20 NOTE — Op Note (Signed)
 Date of procedure:  06/20/2024  Surgeon:  Jackquline CANDIE Barrack, MD   Procedure(s) performed:   In-situ right ulnar nerve decompression at the elbow (CPT [phone])  Assistant(s): none  Preoperative diagnosis:   Right cubital tunnel syndrome   Postoperative diagnosis:   Right cubital tunnel syndrome   Procedure findings:  Constrictive areas across cubital tunnel.  Anesthesia:  Regional + MAC   Estimated blood loss:  Minimal   Specimen:  None   Complications:  None apparent   Implants:  None   Indications: 58 y.o. year old female with right cubital tunnel syndrome that failed conservative management.  We discussed operative treatment and explained the risks, benefits, and alternatives as well as the expected postoperative course.  The patient elected to proceed with surgery.   Procedure in detail:  The patient was met in the pre-op holding area and the right upper extremity was marked and the consent confirmed.  Anesthesia placed a block.  The patient was then taken to the operating room and the hand table was attached to the stretcher.  Antibiotics were not indicated.  All bony prominences were well padded.  A non-sterile tourniquet was placed on the operative arm.  The operative extremity was then prepped and draped in the usual sterile fashion.  A timeout was performed confirming the correct patient, site, and procedure.   The extremity was exsanguinated with an Esmarch and the tourniquet inflated to 250 mm Hg.  A curved incision was made overlying the ulnar nerve at the medial elbow.  Blunt dissection was carried down to the fascia.  Branches of the medial antebrachial cutaneous nerve were identified and protected.  Starting proximally, the fascia was incised and blunt dissection carried down to the level of the ulnar nerve.  The arcade of Struthers, Osborne's ligament, and the superficial and deep fascia of the FCU were carefully released.  Distally, the first motor branch of the ulnar  nerve was identified and protected.  The elbow was ranged and there was no subluxation of the ulnar nerve.     The wound was irrigated with normal saline.  The tourniquet was deflated and hemostasis was obtained with bipolar electrocautery.  The incision was closed with 3-0 Vicryl and 4-0 Nylon.  Sterile dressings were applied.  The patient was transferred to the PACU in stable condition.  Counts were correct x2.

## 2024-06-21 NOTE — Anesthesia Postprocedure Evaluation (Signed)
"   Anesthesia Post Note  Patient: Carla Ellis  Procedure(s) Performed: RELEASE, CUBITAL TUNNEL (Right: Elbow)  Patient location during evaluation: PACU Anesthesia Type: General Level of consciousness: awake and alert Pain management: pain level controlled Vital Signs Assessment: post-procedure vital signs reviewed and stable Respiratory status: spontaneous breathing, nonlabored ventilation and respiratory function stable Cardiovascular status: blood pressure returned to baseline and stable Postop Assessment: no apparent nausea or vomiting Anesthetic complications: no   No notable events documented.   Last Vitals:  Vitals:   06/20/24 1519 06/20/24 1537  BP: 111/81 122/81  Pulse: 78 84  Resp: 15 15  Temp: 36.4 C 37.1 C  SpO2: 93% 95%    Last Pain:  Vitals:   06/20/24 1537  TempSrc: Temporal  PainSc: 0-No pain                 Camellia Merilee Louder      "

## 2024-07-04 ENCOUNTER — Other Ambulatory Visit: Payer: Self-pay | Admitting: Internal Medicine

## 2024-07-04 DIAGNOSIS — Z1231 Encounter for screening mammogram for malignant neoplasm of breast: Secondary | ICD-10-CM

## 2024-08-14 ENCOUNTER — Encounter
